# Patient Record
Sex: Male | Born: 1960 | Race: White | Hispanic: No | Marital: Single | Smoking: Former smoker
Health system: Southern US, Community
[De-identification: ages and names within clinical notes are randomized; demographics above are authoritative.]

## PROBLEM LIST (undated history)

## (undated) DIAGNOSIS — I255 Ischemic cardiomyopathy: Secondary | ICD-10-CM

## (undated) DIAGNOSIS — I6529 Occlusion and stenosis of unspecified carotid artery: Secondary | ICD-10-CM

## (undated) DIAGNOSIS — I219 Acute myocardial infarction, unspecified: Secondary | ICD-10-CM

## (undated) DIAGNOSIS — E785 Hyperlipidemia, unspecified: Secondary | ICD-10-CM

## (undated) DIAGNOSIS — I2510 Atherosclerotic heart disease of native coronary artery without angina pectoris: Secondary | ICD-10-CM

## (undated) DIAGNOSIS — Z720 Tobacco use: Secondary | ICD-10-CM

## (undated) DIAGNOSIS — E119 Type 2 diabetes mellitus without complications: Secondary | ICD-10-CM

## (undated) HISTORY — DX: Atherosclerotic heart disease of native coronary artery without angina pectoris: I25.10

## (undated) HISTORY — DX: Ischemic cardiomyopathy: I25.5

## (undated) HISTORY — DX: Occlusion and stenosis of unspecified carotid artery: I65.29

## (undated) HISTORY — PX: NO PAST SURGERIES: SHX2092

## (undated) HISTORY — DX: Hyperlipidemia, unspecified: E78.5

## (undated) HISTORY — DX: Type 2 diabetes mellitus without complications: E11.9

## (undated) HISTORY — DX: Acute myocardial infarction, unspecified: I21.9

## (undated) HISTORY — DX: Tobacco use: Z72.0

---

## 1997-10-25 ENCOUNTER — Emergency Department (HOSPITAL_COMMUNITY): Admission: EM | Admit: 1997-10-25 | Discharge: 1997-10-25 | Payer: Self-pay | Admitting: *Deleted

## 1998-04-02 ENCOUNTER — Emergency Department (HOSPITAL_COMMUNITY): Admission: EM | Admit: 1998-04-02 | Discharge: 1998-04-02 | Payer: Self-pay | Admitting: Emergency Medicine

## 1998-04-05 ENCOUNTER — Encounter: Payer: Self-pay | Admitting: Emergency Medicine

## 1998-04-05 ENCOUNTER — Ambulatory Visit (HOSPITAL_COMMUNITY): Admission: RE | Admit: 1998-04-05 | Discharge: 1998-04-05 | Payer: Self-pay | Admitting: Emergency Medicine

## 1999-09-16 ENCOUNTER — Encounter: Payer: Self-pay | Admitting: Emergency Medicine

## 1999-09-16 ENCOUNTER — Inpatient Hospital Stay (HOSPITAL_COMMUNITY): Admission: EM | Admit: 1999-09-16 | Discharge: 1999-09-18 | Payer: Self-pay | Admitting: Emergency Medicine

## 1999-09-25 ENCOUNTER — Encounter: Admission: RE | Admit: 1999-09-25 | Discharge: 1999-09-25 | Payer: Self-pay | Admitting: Family Medicine

## 2002-09-12 ENCOUNTER — Emergency Department (HOSPITAL_COMMUNITY): Admission: EM | Admit: 2002-09-12 | Discharge: 2002-09-12 | Payer: Self-pay | Admitting: Emergency Medicine

## 2002-11-10 ENCOUNTER — Emergency Department (HOSPITAL_COMMUNITY): Admission: EM | Admit: 2002-11-10 | Discharge: 2002-11-10 | Payer: Self-pay

## 2004-04-26 ENCOUNTER — Emergency Department (HOSPITAL_COMMUNITY): Admission: EM | Admit: 2004-04-26 | Discharge: 2004-04-26 | Payer: Self-pay | Admitting: Emergency Medicine

## 2009-09-16 ENCOUNTER — Inpatient Hospital Stay (HOSPITAL_COMMUNITY): Admission: AC | Admit: 2009-09-16 | Discharge: 2009-09-21 | Payer: Self-pay

## 2009-09-16 ENCOUNTER — Ambulatory Visit: Payer: Self-pay | Admitting: Internal Medicine

## 2009-09-17 ENCOUNTER — Encounter: Payer: Self-pay | Admitting: Cardiology

## 2009-09-18 ENCOUNTER — Encounter: Payer: Self-pay | Admitting: Cardiology

## 2009-09-20 ENCOUNTER — Encounter: Payer: Self-pay | Admitting: Cardiology

## 2009-09-25 ENCOUNTER — Telehealth: Payer: Self-pay | Admitting: Cardiology

## 2009-09-26 ENCOUNTER — Encounter: Payer: Self-pay | Admitting: Cardiology

## 2009-09-27 ENCOUNTER — Ambulatory Visit: Payer: Self-pay | Admitting: Cardiology

## 2009-09-27 DIAGNOSIS — I5022 Chronic systolic (congestive) heart failure: Secondary | ICD-10-CM

## 2009-09-27 DIAGNOSIS — I6529 Occlusion and stenosis of unspecified carotid artery: Secondary | ICD-10-CM

## 2009-09-27 DIAGNOSIS — E785 Hyperlipidemia, unspecified: Secondary | ICD-10-CM

## 2009-09-27 DIAGNOSIS — I2510 Atherosclerotic heart disease of native coronary artery without angina pectoris: Secondary | ICD-10-CM | POA: Insufficient documentation

## 2009-09-28 LAB — CONVERTED CEMR LAB
CO2: 31 meq/L (ref 19–32)
Calcium: 8.8 mg/dL (ref 8.4–10.5)
Chloride: 102 meq/L (ref 96–112)
GFR calc non Af Amer: 68 mL/min (ref >60)
Potassium: 5.1 meq/L (ref 3.5–5.1)
Pro B Natriuretic peptide (BNP): 401.8 pg/mL — ABNORMAL HIGH (ref 0.0–100.0)
Sodium: 139 meq/L (ref 135–145)

## 2009-10-03 DIAGNOSIS — I6529 Occlusion and stenosis of unspecified carotid artery: Secondary | ICD-10-CM

## 2009-10-03 HISTORY — DX: Occlusion and stenosis of unspecified carotid artery: I65.29

## 2009-10-04 ENCOUNTER — Telehealth: Payer: Self-pay | Admitting: Cardiology

## 2009-10-04 ENCOUNTER — Encounter (HOSPITAL_COMMUNITY): Admission: RE | Admit: 2009-10-04 | Discharge: 2010-01-02 | Payer: Self-pay | Admitting: Cardiology

## 2009-10-23 ENCOUNTER — Encounter: Payer: Self-pay | Admitting: Cardiology

## 2009-10-31 ENCOUNTER — Encounter: Payer: Self-pay | Admitting: Cardiology

## 2009-11-01 ENCOUNTER — Ambulatory Visit: Payer: Self-pay

## 2009-11-01 ENCOUNTER — Encounter: Payer: Self-pay | Admitting: Cardiology

## 2009-11-01 ENCOUNTER — Ambulatory Visit (HOSPITAL_COMMUNITY): Admission: RE | Admit: 2009-11-01 | Discharge: 2009-11-01 | Payer: Self-pay | Admitting: Cardiology

## 2009-11-01 ENCOUNTER — Ambulatory Visit: Payer: Self-pay | Admitting: Cardiology

## 2009-11-13 ENCOUNTER — Encounter: Payer: Self-pay | Admitting: Cardiology

## 2009-11-15 ENCOUNTER — Ambulatory Visit: Payer: Self-pay | Admitting: Endocrinology

## 2009-11-15 ENCOUNTER — Encounter: Payer: Self-pay | Admitting: Endocrinology

## 2009-11-15 DIAGNOSIS — M25559 Pain in unspecified hip: Secondary | ICD-10-CM

## 2009-11-15 DIAGNOSIS — N529 Male erectile dysfunction, unspecified: Secondary | ICD-10-CM

## 2009-11-21 ENCOUNTER — Encounter: Payer: Self-pay | Admitting: Endocrinology

## 2009-11-29 ENCOUNTER — Encounter: Payer: Self-pay | Admitting: Endocrinology

## 2009-12-04 ENCOUNTER — Telehealth: Payer: Self-pay | Admitting: Cardiology

## 2009-12-06 ENCOUNTER — Encounter (INDEPENDENT_AMBULATORY_CARE_PROVIDER_SITE_OTHER): Payer: Self-pay | Admitting: *Deleted

## 2009-12-28 ENCOUNTER — Ambulatory Visit: Payer: Self-pay | Admitting: Endocrinology

## 2010-01-11 ENCOUNTER — Ambulatory Visit: Payer: Self-pay | Admitting: Cardiology

## 2010-01-11 ENCOUNTER — Ambulatory Visit (HOSPITAL_COMMUNITY): Admission: RE | Admit: 2010-01-11 | Discharge: 2010-01-11 | Payer: Self-pay | Admitting: Cardiology

## 2010-02-01 ENCOUNTER — Encounter: Payer: Self-pay | Admitting: Endocrinology

## 2010-02-15 ENCOUNTER — Ambulatory Visit: Payer: Self-pay | Admitting: Endocrinology

## 2010-02-15 DIAGNOSIS — R5381 Other malaise: Secondary | ICD-10-CM | POA: Insufficient documentation

## 2010-02-15 DIAGNOSIS — R5383 Other fatigue: Secondary | ICD-10-CM

## 2010-02-16 ENCOUNTER — Telehealth (INDEPENDENT_AMBULATORY_CARE_PROVIDER_SITE_OTHER): Payer: Self-pay | Admitting: *Deleted

## 2010-02-26 ENCOUNTER — Ambulatory Visit: Payer: Self-pay | Admitting: Cardiology

## 2010-03-08 ENCOUNTER — Ambulatory Visit: Payer: Self-pay | Admitting: Endocrinology

## 2010-03-15 ENCOUNTER — Ambulatory Visit: Payer: Self-pay | Admitting: Cardiology

## 2010-03-19 ENCOUNTER — Telehealth: Payer: Self-pay | Admitting: Endocrinology

## 2010-03-21 ENCOUNTER — Telehealth: Payer: Self-pay | Admitting: Cardiology

## 2010-03-21 LAB — CONVERTED CEMR LAB
BUN: 21 mg/dL (ref 6–23)
Basophils Relative: 0.6 % (ref 0.0–3.0)
CO2: 30 meq/L (ref 19–32)
Calcium: 9.2 mg/dL (ref 8.4–10.5)
GFR calc non Af Amer: 100.65 mL/min (ref >60)
Glucose, Bld: 295 mg/dL — ABNORMAL HIGH (ref 70–99)
HCT: 36.1 % — ABNORMAL LOW (ref 39.0–52.0)
Monocytes Relative: 7.9 % (ref 3.0–12.0)
Neutro Abs: 4.1 10*3/uL (ref 1.4–7.7)
RDW: 13.1 % (ref 11.5–14.6)

## 2010-04-12 ENCOUNTER — Ambulatory Visit: Payer: Self-pay | Admitting: Endocrinology

## 2010-05-05 DIAGNOSIS — I219 Acute myocardial infarction, unspecified: Secondary | ICD-10-CM

## 2010-05-05 HISTORY — DX: Acute myocardial infarction, unspecified: I21.9

## 2010-05-05 HISTORY — PX: CARDIAC CATHETERIZATION: SHX172

## 2010-05-14 ENCOUNTER — Telehealth: Payer: Self-pay | Admitting: Cardiology

## 2010-05-16 ENCOUNTER — Telehealth: Payer: Self-pay | Admitting: Cardiology

## 2010-05-20 ENCOUNTER — Telehealth: Payer: Self-pay | Admitting: Cardiology

## 2010-05-26 ENCOUNTER — Encounter: Payer: Self-pay | Admitting: Cardiology

## 2010-06-04 NOTE — Assessment & Plan Note (Signed)
Summary: NEW ENDO CON/UHC / DM /NWS   Vital Signs:  Patient profile:   50 year old male Height:      69 inches (175.26 cm) Weight:      187 pounds (85 kg) BMI:     27.71 O2 Sat:      97 % on Room air Temp:     99.9 degrees F (37.72 degrees C) oral Pulse rate:   62 / minute Pulse rhythm:   regular BP sitting:   112 / 68  (left arm) Cuff size:   regular  Vitals Entered By: Brenton Grills MA (November 15, 2009 3:57 PM)  O2 Flow:  Room air CC: New endo pt/DM/aj  Comments Pt is only using Novolog Flex Pen--Novolog 100U/ml should be removed from list   Primary Provider:  [facility]  CC:  New endo pt/DM/aj .  History of Present Illness: pt states 14 years h/o dm.  it is complicated by cad.  he has been on insulin since dx.  he takes lantus 30 units qd, and prn novolog (avg 24-30 unitd/day total).   no cbg record, but states cbg's were well-controlled, until his mi (may, 2011).  since then, it is highest in am (200's), then lower at other times of day.  it is higher in am than at hs, despite no hs-snack.  he has hypolgycemia before lunch. pt says his diet is "not very good," and exercise is "good."   symptomatically, pt states few mos of moderate left hip pain, but no associated numbness.  Current Medications (verified): 1)  Effient 10 Mg Tabs (Prasugrel Hcl) .... Take One Tablet Once Daily 2)  Crestor 40 Mg Tabs (Rosuvastatin Calcium) .... Take One Tablet Once Daily 3)  Coreg 12.5 Mg Tabs (Carvedilol) .... One Twice A Day 4)  Spironolactone 25 Mg Tabs (Spironolactone) .... Take One Tablet Once Daily 5)  Enalapril Maleate 5 Mg Tabs (Enalapril Maleate) .... Take 1.5 Tablets Two Times A Day 6)  Vitamin E 400 Unit Caps (Vitamin E) .... 2 Capsules Daily 7)  Cinnamon Oil  Oil (Cassia Oil) .Marland Kitchen.. 1000 Two Times A Day 8)  Lantus 100 Unit/ml Soln (Insulin Glargine) .... 30 Units Daily 9)  Novolog 100 Unit/ml Soln (Insulin Aspart) .... Sliding Scale 10)  Novolog Flexpen 100 Unit/ml Soln (Insulin  Aspart) .... Sliding Scale 11)  Aspirin Ec 325 Mg Tbec (Aspirin) .... Take One Tablet By Mouth Daily 12)  Tramadol Hcl 50 Mg Tabs (Tramadol Hcl) .... Take 1 or 2 Tablets Two Times A Day As Needed For Pain 13)  Fish Oil 1000 Mg Caps (Omega-3 Fatty Acids) .... One Twice A Day 14)  Nitrostat 0.4 Mg Subl (Nitroglycerin) .Marland Kitchen.. 1 Sl As Needed Chest Pain 15)  Garlic-Parsley 147-82 Mg Caps (Garlic-Parsley) .Marland Kitchen.. 1 Capsule Once Daily  Allergies (verified): 1)  ! Codeine  Past History:  Past Medical History: Last updated: 09/27/2009 1. Diabetes mellitus: on insulin 2. Ischemic cardiomyopathy: Echo (5/11) EF 30-35% with inferior and inferoseptal akinesis, posterior hypokinesis, mild MR, mild to moderately decreased RV systolic function, dilated IVC.  3. Coronary artery disease: Inferior STEMI 5/11 complicated by cardiac arrest requiring CPR.  LHC showed total occlusion proximal RCA, 60-70% proximal LAD at D1, and 60% ostial D1.  Patient had 2 overlapping PROMUS stents to the RCA.  4. Probably foley catheter trauma with hematuria (5/11) 5. Tobacco abuse 6.  Hyperlipidemia  Family History: Reviewed history from 09/27/2009 and no changes required. Notable for a father with a myocardial  infarction in his 65s.  Family History of Arthritis Heart Disease (Parents) Family History Diabetes 1st degree relative (grandparent) Family History High cholesterol Family History Hypertension Family History Lung cancer Family History of Stroke   Social History: Reviewed history from 09/27/2009 and no changes required. The patient lives in [place] with his wife and son.  He has a 1-pack-per-day history of smoking for approximately 30 years.  He denies any alcohol or drug use. Works in [employer].   Review of Systems       The patient complains of weight gain and depression.  The patient denies syncope.         denies headache, chest pain, sob, n/v, urinary frequency, cramps, excessive diaphoresis,  rhinorrhea, and easy bruising.  he has blurry vision, and mild memory loss and erectile dysfunction.  Physical Exam  General:  normal appearance.   Head:  there is a 2 cm sebaceous cyst on the crown of the head head: no deformity eyes: no periorbital swelling, no proptosis external nose and ears are normal mouth: no lesion seen Neck:  Supple without thyroid enlargement or tenderness.  Lungs:  Clear to auscultation bilaterally. Normal respiratory effort.  Heart:  Regular rate and rhythm without murmurs or gallops noted. Normal S1,S2.   Abdomen:  abdomen is soft, nontender.  no hepatosplenomegaly.   not distended.  no hernia  Msk:  left hip is nontender muscle bulk and strength are grossly normal.  no obvious joint swelling.  gait is normal and steady  Pulses:  dorsalis pedis intact bilat.  no carotid bruit  Extremities:  no deformity.  no ulcer on the feet.  feet are of normal color and temp.  no edema  Neurologic:  cn 2-12 grossly intact.   readily moves all 4's.   sensation is intact to touch on the feet  Skin:  normal texture and temp.  no rash.  not diaphoretic  Cervical Nodes:  No significant adenopathy.  Psych:  Alert and cooperative; normal mood and affect; normal attention span and concentration.   Additional Exam:  a1c=9.2 (in hospital)   Impression & Recommendations:  Problem # 1:  DIAB W/O COMP TYPE II/UNS NOT STATED UNCNTRL (ICD-250.00) needs increased rx  Problem # 2:  CHRONIC SYSTOLIC HEART FAILURE (ICD-428.22) as he is at risk for tachydysrhythmia, it is very important to avoid hypoglycemia, so he should have a continuous glucose monitor.    Problem # 3:  HIP PAIN, LEFT (ICD-719.45) not related to #1  Problem # 4:  blurry vision could be related to #1  Medications Added to Medication List This Visit: 1)  Lantus 100 Unit/ml Soln (Insulin glargine) .... 30 units daily, and 31g syringes 2)  Novolog Flexpen 100 Unit/ml Soln (Insulin aspart) .... Three times  a day (just before each meal) 12-09-08 units 3)  Garlic-parsley 914-78 Mg Caps (Garlic-parsley) .Marland Kitchen.. 1 capsule once daily 4)  Bayer Breeze 2 Test Disk (Glucose blood) .... Three times a day, and lancets 250.01 5)  Bd Pen Needle Short U/f 31g X 8 Mm Misc (Insulin pen needle) .... 4x a day  Other Orders: T-Hip Comp Left Min 2-views (73510TC) Diabetic Clinic Referral (Diabetic) Consultation Level IV (29562)  Patient Instructions: 1)  good diet and exercise habits significanly improve the control of your diabetes.  please let me know if you wish to be referred to a dietician.  high blood sugar is very risky to your health.  in view of you heart condition, low-blood sugar is very  risky, also.  you should see an eye doctor every year. 2)  controlling your blood pressure and cholesterol drastically reduces the damage diabetes does to your body.  this also applies to quitting smoking.  please discuss these with your doctor.  you should take an aspirin every day, unless you have been advised by a doctor not to. 3)  we will need to take this complex situation in stages 4)  check your blood sugar 2-3 times a day.  vary the time of day when you check, between before the 3 meals, and at bedtime.  also check if you have symptoms of your blood sugar being too high or too low.  please keep a record of the readings and bring it to your next appointment here.  please call us sooner if you are having low blood sugar episodes. 5)  continue lantus 30 units once daily 6)  take novolog at a fixed-dosage (just before each meal) 12-09-08 units.  if exertion is anticipated, subtract 1 unit of novolog from that injection.  if blood sugar is over 250, take 1 extra unit of novolog. 7)  refer diabetes educator, to consider continuous glucose monitor.  you will be called with a day and time for an appointment. 8)  Please schedule a follow-up appointment in 1 month. 9)  x ray of your left hip.  please call [phone] to hear your test  results. Prescriptions: LANTUS 100 UNIT/ML SOLN (INSULIN GLARGINE) 30 units daily, and 31g syringes  #3 vials x 3   Entered and Authorized by:   Minus Breeding MD   Signed by:   Minus Breeding MD on 11/15/2009   Method used:   Electronically to        [facility] [address] [phone]* (retail)       [address]       [place], [place]  [address]       Ph: [phone] or [phone]       Fax: [phone]   RxID:   [id number] BD PEN NEEDLE SHORT U/F 31G X 8 MM MISC (INSULIN PEN NEEDLE) 4x a day  #120 x 11   Entered and Authorized by:   Minus Breeding MD   Signed by:   Minus Breeding MD on 11/15/2009   Method used:   Electronically to        [facility] [address] [phone]* (retail)       [address]       [place], [place]  [address]       Ph: [phone] or [phone]       Fax: [phone]   RxID:   [id number] BAYER BREEZE 2 TEST  DISK (GLUCOSE BLOOD) three times a day, and lancets 250.01  #100 x 11   Entered and Authorized by:   Minus Breeding MD   Signed by:   Minus Breeding MD on 11/15/2009   Method used:   Electronically to        [facility] [address] [phone]* (retail)       [address]       [place], [place]  [address]       Ph: [phone] or [phone]       Fax: [phone]   RxID:   [id number]    Preventive Care Screening  Last Tetanus Booster:    Date:  05/06/2007    Results:  Historical     Immunization History:  Pneumovax Immunization History:    Pneumovax:  historical (02/02/2009)

## 2010-06-04 NOTE — Letter (Signed)
Summary: [facility]  [facility]   Imported By: Sherian Rein 12/06/2009 09:43:37  _____________________________________________________________________  External Attachment:    Type:   Image     Comment:   External Document

## 2010-06-04 NOTE — Letter (Signed)
Summary: Generic Letter  Architectural technologist, Main Office  [address]   [place], [place] [address]   Phone: [phone]  Fax: [phone]        November 01, 2009 MRN: [id number]    [facility] [place] [address] [place], [place]  [address]    To Whom It May Concern:  Mr. Treese can return to work Tuesday July 5,2011 without restrictions.         Sincerely,    Tristyn Pharris,MD  This letter has been electronically signed by your physician.

## 2010-06-04 NOTE — Letter (Signed)
Summary: [facility]  [facility]   Imported By: Lester [place] 02/15/2010 10:29:21  _____________________________________________________________________  External Attachment:    Type:   Image     Comment:   External Document

## 2010-06-04 NOTE — Progress Notes (Signed)
Summary: pt needs meds for tooth  Phone Note Call from Patient Call back at [phone]   Caller: Spouse/janice Reason for Call: Talk to Nurse, Lab or Test Results Summary of Call: pt wife states dr. Shirlee Latch would give  pt antibiotics for his tooth if their was a problem with the dentist. pt also needs blood work results. Initial call taken by: Roe Coombs,  March 21, 2010 11:04 AM  Follow-up for Phone Call        [phone] talked with wife--pt had gone to dentist to have tooth extracted--pt has decided he would like to see an oral surgeon--she has made an appointment with the oral surgeon --per wife --now pt is having tooth pain , the dentist has told the patient the tooth is infected but wants Dr Shirlee Latch  to prescribe an antibiotic because of his medicaiton and cardiac history--I will forward to Dr Shirlee Latch for review      Appended Document: pt needs meds for tooth Whatever the dentist would typically prescribe would be ok, if he wants me to prescribe it patient could take Augmentin 875 two times a day x 10 days.   Appended Document: pt needs meds for tooth    Clinical Lists Changes  Medications: Added new medication of AUGMENTIN 875-125 MG TABS (AMOXICILLIN-POT CLAVULANATE) one twice a day for 10 days - Signed Rx of AUGMENTIN 875-125 MG TABS (AMOXICILLIN-POT CLAVULANATE) one twice a day for 10 days;  #20 x 0;  Signed;  Entered by: Katina Dung, RN, BSN;  Authorized by: Marca Ancona, MD;  Method used: [id number] [address] [place], [place], [place]  [address], Ph: [phone], Fax: [phone]    Prescriptions: AUGMENTIN 875-125 MG TABS (AMOXICILLIN-POT CLAVULANATE) one twice a day for 10 days  #20 x 0   Entered by:   Katina Dung, RN, BSN   Authorized by:   Marca Ancona, MD   Signed by:   Katina Dung, RN, BSN on 03/21/2010   Method used:   [facility]        [facility]  [address] [id number] ([facility]ail)       [address]       [place], [place]  [address]       Ph: [phone]       Fax: [phone]   RxID:   [id number]   pt's wife aware

## 2010-06-04 NOTE — Progress Notes (Signed)
  Phone Note Call from Patient   Caller: Patient [phone] Summary of Call: Pt called stating that Breeze 2 test strips are no longer covered by his Insurance. Pt is requesting to change to OneTouch UltraMini Glucometer, test strips and lancets. Please advise Initial call taken by: Margaret Pyle, CMA,  March 19, 2010 10:36 AM  Follow-up for Phone Call        i sent Follow-up by: Minus Breeding MD,  March 19, 2010 12:41 PM  Additional Follow-up for Phone Call Additional follow up Details #1::        Patient wife notified and he wil pick up at pharmacy.Alvy Beal Archie CMA  March 19, 2010 1:28 PM     New/Updated Medications: ONETOUCH ULTRA MINI W/DEVICE KIT (BLOOD GLUCOSE MONITORING SUPPL) as dir ONETOUCH ULTRA BLUE  STRP (GLUCOSE BLOOD) 4x a day, and lancets 250.03.  variable glucoses. Prescriptions: ONETOUCH ULTRA BLUE  STRP (GLUCOSE BLOOD) 4x a day, and lancets 250.03.  variable glucoses.  #120 x 11   Entered and Authorized by:   Minus Breeding MD   Signed by:   Minus Breeding MD on 03/19/2010   Method used:   Electronically to        [facility] [address] [phone]* (retail)       [address]       [place], [place]  [address]       Ph: [phone] or [phone]       Fax: [phone]   RxID:   [id number] [place] [address] MINI W/DEVICE KIT (BLOOD GLUCOSE MONITORING SUPPL) as dir  #1 device x 0   Entered and Authorized by:   Minus Breeding MD   Signed by:   Minus Breeding MD on 03/19/2010   Method used:   Electronically to        [facility] [address] [phone]* (retail)       [address]       [place], [place]  [address]       Ph: [phone] or [phone]       Fax: [phone]   RxID:   [id number]

## 2010-06-04 NOTE — Miscellaneous (Signed)
Summary: [facility]   [facility]   Imported By: Roderic Ovens 11/09/2009 15:38:54  _____________________________________________________________________  External Attachment:    Type:   Image     Comment:   External Document

## 2010-06-04 NOTE — Letter (Signed)
Summary: Primary Care Appointment Letter  [facility]  [address] [place], [place] [address]   Phone: [phone]  Fax: [phone]    11/21/2009 MRN: [id number]  [facility] [place] [address] [place], [place]  [address]  Dear Mr. Erik Bird,   Your Primary Care Physician Minus Breeding MD has indicated that:    ___X____it is time to schedule an appointment for a one month follow-up with Dr. Everardo All in mid August.  Please call the office.    _______you missed your appointment on______ and need to call and          reschedule.    _______you need to have lab work done.    _______you need to schedule an appointment discuss lab or test results.    _______you need to call to reschedule your appointment that is                       scheduled on _________.     Please call our office as soon as possible. Our phone number is [phone]. Please press option 1. Our office is open 8a-12noon and 1p-5p, Monday through Friday.    Please have your wife call to set up an appointment with Dr. Yetta Barre or Dr. Felicity Coyer to establish for Primary Care.   Thank you,    [facility]

## 2010-06-04 NOTE — Assessment & Plan Note (Signed)
Summary: p hosp   Primary Provider:  [facility]  CC:  p hosp.  Pt needs disability papers filled out due to loss of work.  Headache still continuous.  Pt has gotten no phone calls from Cardiac Rehab and has no idea what type of care he should be getting.  Spouse has no idea how she should be caring for her husband.  Spouse reports urinary bleeding and constant urinary pain.  Marland Kitchen  History of Present Illness: 50 yo with history of diabetes was admitted to [facility] in 5/11 with inferior STEMI. Patient had collapsed at home and got CPR from his son and EMS for cardiac arrest.  He had a perfusing rhythm in the ER.  Left heart cath showed a totally occluded RCA which was treated with 2 PROMUS drug-eluting stents.  Echo showed EF 30-35% with inferior and inferoseptal akinesis and RV dysfunction.  Patient's course was complicated by chest pain from broken ribs (CPR) and probable foley catheter trauma with hematuria and dysuria after the catheter was removed.   Since discharge, patient has continued to have hematuria, though it is improved.  He has followed up with Dr Annabell Howells (urology) and plan has been to observe to see if hematuria resolves over the next week or so.  Patient continues to have rib pain.  He is walking with his wife up and down his 70 foot driveway several times a day.  He denies shortness of breath, orthopnea, or PND.  He has had a mild nagging headache ever since he was in the hospital.    ECG: NSR, old inferior MI  Labs (5/11): K 4.5, creatinine 1.23, BNP 158, LDL 119, HDL 49  Current Medications (verified): 1)  Furosemide 20 Mg Tabs (Furosemide) .... Take One Tablet Once Daily 2)  Effient 10 Mg Tabs (Prasugrel Hcl) .... Take One Tablet Once Daily 3)  Crestor 40 Mg Tabs (Rosuvastatin Calcium) .... Take One Tablet Once Daily 4)  Carvedilol 6.25 Mg Tabs (Carvedilol) .... Take One Tablet Two Times A Day 5)  Phenazopyridine Hcl 200 Mg Tabs (Phenazopyridine Hcl) .... Take One Tablet Three  Times A Day 6)  Spironolactone 25 Mg Tabs (Spironolactone) .... Take One Tablet Once Daily 7)  Enalapril Maleate 5 Mg Tabs (Enalapril Maleate) .... Take 1.5 Tablets Two Times A Day 8)  Vitamin E 400 Unit Caps (Vitamin E) .... 2 Capsules Daily 9)  Cinnamon Oil  Oil (Cassia Oil) .Marland Kitchen.. 1000 Two Times A Day 10)  Lantus 100 Unit/ml Soln (Insulin Glargine) .... 30 Units Daily 11)  Novolog 100 Unit/ml Soln (Insulin Aspart) .... Sliding Scale 12)  Novolog Flexpen 100 Unit/ml Soln (Insulin Aspart) .... Sliding Scale 13)  Aspirin Ec 325 Mg Tbec (Aspirin) .... Take One Tablet By Mouth Daily  Allergies (verified): 1)  ! Codeine  Past History:  Past Medical History: 1. Diabetes mellitus: on insulin 2. Ischemic cardiomyopathy: Echo (5/11) EF 30-35% with inferior and inferoseptal akinesis, posterior hypokinesis, mild MR, mild to moderately decreased RV systolic function, dilated IVC.  3. Coronary artery disease: Inferior STEMI 5/11 complicated by cardiac arrest requiring CPR.  LHC showed total occlusion proximal RCA, 60-70% proximal LAD at D1, and 60% ostial D1.  Patient had 2 overlapping PROMUS stents to the RCA.  4. Probably foley catheter trauma with hematuria (5/11) 5. Tobacco abuse 6.  Hyperlipidemia  Family History: Notable for a father with a myocardial infarction in his 46s.   Social History: The patient lives in [place] with his wife  and son.  He has a 1-pack-per-day history of smoking for approximately 30 years.  He denies any alcohol or drug use. Works in [employer].   Review of Systems       All systems reviewed and negative except as per HPI.   Vital Signs:  Patient profile:   50 year old male Height:      69 inches Weight:      181 pounds BMI:     26.83 Pulse rate:   71 / minute Pulse rhythm:   regular BP sitting:   116 / 60  (left arm) Cuff size:   regular  Vitals Entered By: Judithe Modest CMA (Sep 27, 2009 2:00 PM)  Physical Exam  General:  Well developed,  well nourished, in no acute distress. Head:  normocephalic and atraumatic Nose:  no deformity, discharge, inflammation, or lesions Mouth:  Teeth, gums and palate normal. Oral mucosa normal. Neck:  Neck supple, no JVD. No masses, thyromegaly or abnormal cervical nodes. Lungs:  Clear bilaterally to auscultation and percussion. Heart:  Non-displaced PMI, chest non-tender; regular rate and rhythm, S1, S2 without murmurs, rubs or gallops. Carotid upstroke normal, left carotid bruit.  Pedals normal pulses. 1+ ankle edema. Abdomen:  Bowel sounds positive; abdomen soft and non-tender without masses, organomegaly, or hernias noted. No hepatosplenomegaly. Msk:  Back normal, normal gait. Muscle strength and tone normal. Extremities:  No clubbing or cyanosis. Neurologic:  Alert and oriented x 3. Skin:  Intact without lesions or rashes. Psych:  Normal affect.   Impression & Recommendations:  Problem # 1:  CORONARY ATHEROSCLEROSIS NATIVE CORONARY ARTERY (ICD-414.01) Stable s/p inferior STEMI with RCA PCI.  He will need prasugrel for at least a year as well as ASA. Continue Crestor, Coreg, ACEI.  Patient has a residual moderate LAD stenosis.  Patient will start cardiac rehab.   Problem # 2:  CHRONIC SYSTOLIC HEART FAILURE (ICD-428.22) Ischemic cardiomyopathy with EF 30-35% on echo.  Patient does not look particularly volume overloaded today, and I wonder if a component of dehydration is driving his headaches.  I will have him stop Lasix for now and follow his weights.  If weight is going up or he becomes more short of breath, he will restart Lasix. Continue current doses of Coreg, enalapril, and spironolactone.  Will check BMET/BNP today.  I will have him get an echo in 1 month.  If EF is still depressed, he will need an ICD.   Problem # 3:  CAROTID ARTERY STENOSIS (ICD-433.10) Left carotid bruit (soft), will get carotid dopplers.   Problem # 4:  HYPERLIPIDEMIA-MIXED (ICD-272.4) Goal LDL < 70.  Will need  lipids/LFTs in 7/11.   He will try tramadol for pain (dysuria, pain in chest wall).   Other Orders: TLB-BMP (Basic Metabolic Panel-BMET) (80048-METABOL) TLB-BNP (B-Natriuretic Peptide) (83880-BNPR) Echocardiogram (Echo) Carotid Duplex (Carotid Duplex)  Patient Instructions: 1)  Your physician recommends that you schedule a follow-up appointment in: 1 month (after echo) 2)  Your physician has recommended you make the following change in your medication: Stop furosemide 3)  Start tramadol 50 mg one or two tablets by mouth two times a day as needed for pain 4)  Your physician has requested that you have a carotid duplex. This test is an ultrasound of the carotid arteries in your neck. It looks at blood flow through these arteries that supply the brain with blood. Allow one hour for this exam. There are no restrictions or special instructions. 5)  Your physician has requested  that you have an echocardiogram.  Echocardiography is a painless test that uses sound waves to create images of your heart. It provides your doctor with information about the size and shape of your heart and how well your heart's chambers and valves are working.  This procedure takes approximately one hour. There are no restrictions for this procedure. To be done in one month Prescriptions: TRAMADOL HCL 50 MG TABS (TRAMADOL HCL) take 1 or 2 tablets two times a day as needed for pain  #60 x 1   Entered by:   Dossie Arbour, RN, BSN   Authorized by:   Marca Ancona, MD   Signed by:   Dossie Arbour, RN, BSN on 09/27/2009   Method used:   Print then Give to Patient   RxID:   [id number]

## 2010-06-04 NOTE — Progress Notes (Signed)
Summary: wife calling re setting up mri  Phone Note Call from Patient   Caller: Spouse janice Reason for Call: Talk to Nurse Summary of Call: pt's wife was called with echo results last week, it was determined that a mri would be neccessary and was told by anne that someone would call her and set it up, they haven't heard anything-pls call [phone] Initial call taken by: Glynda Jaeger,  December 04, 2009 8:22 AM  Follow-up for Phone Call        SPOKE WITH GEISLA  WILL CALL PT AND [facility] CARDIAC MRI   Follow-up by: Scherrie Bateman, LPN,  December 04, 2009 10:29 AM

## 2010-06-04 NOTE — Miscellaneous (Signed)
Summary: [facility]   [facility]   Imported By: Roderic Ovens 11/21/2009 10:32:13  _____________________________________________________________________  External Attachment:    Type:   Image     Comment:   External Document

## 2010-06-04 NOTE — Miscellaneous (Signed)
Summary: Appointment Canceled  Appointment status changed to canceled by [employer] on 11/15/2009 2:04 PM.  Cancellation Comments --------------------- echo/414.01/uch/no prec. req/saf  Appointment Information ----------------------- Appt Type:  CARDIOLOGY ANCILLARY VISIT      Date:  Tuesday, December 25, 2009      Time:  2:00 PM for 60 min   Urgency:  Routine   Made By:  Pearson Grippe  To Visit:  [id number]    Reason:  echo/414.01/uch/no prec. req/saf  Appt Comments ------------- -- 11/15/09 14:04: ([facility]) CANCELED -- echo/414.01/uch/no prec. req/saf -- 11/01/09 11:15: ([facility]) BOOKED -- Routine CARDIOLOGY ANCILLARY VISIT at 12/25/2009 2:00 PM for 60 min echo/414.01/uch/no prec. req/saf

## 2010-06-04 NOTE — Assessment & Plan Note (Signed)
Summary: per check out/sf   Primary Provider:  Minus Breeding MD   History of Present Illness: 50 yo with history of diabetes was admitted to [facility] in 5/11 with inferior STEMI. Patient had collapsed at home and got CPR from his son and EMS for cardiac arrest.  He had a perfusing rhythm in the ER.  Left heart cath showed a totally occluded RCA which was treated with 2 PROMUS drug-eluting stents.  Followup echo showed EF 35-40% with inferior and inferoseptal akinesis.  Cardiac MRI was done in 9/11 to quantify EF for possible ICD.  This showed EF 42% with inferior scar.   Patient has been doing well in general.  He is back at work full time.  He is very active and able to do all physical work without any problem.  No exertional dyspnea or chest pain.  Blood glucose is coming under control.  Main complaint has been fatigue.  His energy level is not the same as before MI.  He also is having some pain from a broken tooth that needs extraction.   Labs (5/11): K 4.5, creatinine 1.23, BNP 158, LDL 119, HDL 41, BNP 402  Current Medications (verified): 1)  Effient 10 Mg Tabs (Prasugrel Hcl) .... Take One Tablet Once Daily 2)  Crestor 40 Mg Tabs (Rosuvastatin Calcium) .... Take One Tablet Once Daily 3)  Coreg 12.5 Mg Tabs (Carvedilol) .... One Twice A Day 4)  Spironolactone 25 Mg Tabs (Spironolactone) .... Take One Tablet Once Daily 5)  Enalapril Maleate 5 Mg Tabs (Enalapril Maleate) .... Take 1.5 Tablets Two Times A Day 6)  Vitamin E 400 Unit Caps (Vitamin E) .... 2 Capsules Daily 7)  Cinnamon Oil  Oil (Cassia Oil) .Marland Kitchen.. 1000 Two Times A Day 8)  Novolog Flexpen 100 Unit/ml Soln (Insulin Aspart) .... As Needed Use 9)  Aspirin Ec 325 Mg Tbec (Aspirin) .... Take One Tablet By Mouth Daily 10)  Tramadol Hcl 50 Mg Tabs (Tramadol Hcl) .... Take 1 or 2 Tablets Two Times A Day As Needed For Pain 11)  Fish Oil 1000 Mg Caps (Omega-3 Fatty Acids) .... One Twice A Day 12)  Nitrostat 0.4 Mg Subl (Nitroglycerin)  .Marland Kitchen.. 1 Sl As Needed Chest Pain 13)  Garlic-Parsley 347-42 Mg Caps (Garlic-Parsley) .Marland Kitchen.. 1 Capsule Once Daily 14)  Bayer Breeze 2 Test  Disk (Glucose Blood) .... Three Times A Day, and Lancets 250.01 15)  Bd Pen Needle Short U/f 31g X 8 Mm Misc (Insulin Pen Needle) .... 4x A Day 16)  Lantus Solostar 100 Unit/ml Soln (Insulin Glargine) .... 55 Units Each Am, and Pen Needles 4x A Day  Allergies (verified): 1)  ! Codeine  Past History:  Past Medical History: 1. Diabetes mellitus: on insulin.  Developed diabetes after a coxsackievirus infection.  2. Ischemic cardiomyopathy: Echo (5/11) EF 30-35% with inferior and inferoseptal akinesis, posterior hypokinesis, mild MR, mild to moderately decreased RV systolic function, dilated IVC.   Echo (6/11): EF 35-40%, inferior and inferoseptal akinesis, no significant valvular abnormalities.  Cardiac MRI (9/11): EF 42%, mildly dilated LV, mid to apical inferior and mid posterior 76-99% thickness subendocardial delayed enhancement.  3. Coronary artery disease: Inferior STEMI 5/11 complicated by cardiac arrest requiring CPR.  LHC showed total occlusion proximal RCA, 60-70% proximal LAD at D1, and 60% ostial D1.  Patient had 2 overlapping PROMUS stents to the RCA.  4. Probably foley catheter trauma with hematuria (5/11) 5. Tobacco abuse 6.  Hyperlipidemia 7. Carotid doppler (6/11): 0-39%  bilateral ICA stenosis.   Family History: Reviewed history from 11/15/2009 and no changes required. Notable for a father with a myocardial infarction in his 68s.  Family History of Arthritis Heart Disease (Parents) Family History Diabetes 1st degree relative (grandparent) Family History High cholesterol Family History Hypertension Family History Lung cancer Family History of Stroke   Social History: The patient lives in [place] with his wife and son.  He has a 1-pack-per-day history of smoking for approximately 30 years, quit smoking in 2011.  He denies any  alcohol or drug use. Works in [employer].   Review of Systems       All systems reviewed and negative except as per HPI.   Vital Signs:  Patient profile:   50 year old male Height:      69 inches Weight:      193 pounds BMI:     28.60 Pulse rate:   57 / minute Resp:     18 per minute BP sitting:   135 / 77  (right arm)  Vitals Entered By: Marrion Coy, CNA (February 26, 2010 4:16 PM)  Physical Exam  General:  normal appearance.   Neck:  Neck supple, no JVD. No masses, thyromegaly or abnormal cervical nodes. Lungs:  Clear to auscultation bilaterally. Normal respiratory effort.  Heart:  Non-displaced PMI, chest non-tender; regular rate and rhythm, S1, S2 without murmurs, rubs or gallops. Carotid upstroke normal, no bruit.Pedals normal pulses. No edema, no varicosities. Abdomen:  Bowel sounds positive; abdomen soft and non-tender without masses, organomegaly, or hernias noted. No hepatosplenomegaly. Extremities:  No clubbing or cyanosis. Neurologic:  Alert and oriented x 3. Psych:  Normal affect.   Impression & Recommendations:  Problem # 1:  CORONARY ATHEROSCLEROSIS NATIVE CORONARY ARTERY (ICD-414.01) Stable s/p inferior STEMI with RCA PCI.  He will need prasugrel for at least a year as well as ASA. Continue Crestor, Coreg, ACEI.  Patient has a residual moderate LAD stenosis.  He is doing overall well and is back at work.  He has some fatigue, likely from the MI itself as well as from the medications that he is now taking.  He has a tooth that needs extraction.  This should be done on prasugrel, or if not possible to do on prasugrel, he should wait to have it done.   Problem # 2:  ISCHEMIC CARDIOMYOPATHY No significant exertional dyspnea.  EF 42% by MRI, does not qualify for ICD.  Continue current doses of enalapril, Coreg, and spironolactone.   Problem # 3:  HYPERLIPIDEMIA-MIXED (ICD-272.4) Needs lipids/LFTs checked.  Goal LDL < 70.   Patient Instructions: 1)  Your physician  recommends that you return for a FASTING lipid profile/liver profile/BMP/BNP/CBC  414.01 v58.69 272.0   in a week or two. 2)  Your physician wants you to follow-up in: 6 months with Dr Shirlee Latch.  You will receive a reminder letter in the mail two months in advance. If you don't receive a letter, please call our office to schedule the follow-up appointment.

## 2010-06-04 NOTE — Miscellaneous (Signed)
Summary: [facility]   [facility]   Imported By: Roderic Ovens 11/09/2009 11:05:59  _____________________________________________________________________  External Attachment:    Type:   Image     Comment:   External Document

## 2010-06-04 NOTE — Miscellaneous (Signed)
Summary: [facility] Physician Order/Treatment Plan  [facility] Order/Treatment Plan   Imported By: Roderic Ovens 10/11/2009 15:11:39  _____________________________________________________________________  External Attachment:    Type:   Image     Comment:   External Document

## 2010-06-04 NOTE — Progress Notes (Signed)
Summary: Feeling Bad  Phone Note Call from Patient Call back at [phone]   Caller: wife Summary of Call: Returned call to pt's wife concerning him not feeling well for 72 hours.  While the pt was working on his car today he becam N, diaphoretic and pale (per wife).  These symptoms lasted approximately 45 min and were relieved on there own.  In speaking with her husband he has had 2 similar episodes the previous 2 days while at work.  He has also been fatigued for the past 2 weeks.  The wife states these symptoms are similar to the symptoms the pt had in May with his MI.  He is currently symptoms free.  He is reluctant to come to the ER for finacial reasons and has not taken any nitro secondary to side effects of HA.  His wife called to discuss what to do and when to use his nitro.  I have informed her that if the symptoms are similar to the ones with his MI then it would be my advised to come to the ER for further evaluation.  I have also discussed with her that the pt can use his nitro when he begins to have symptoms like his previous MI, as this does not appear to be chest pain.  The wife voiced understanding and was going to disucss this with her husband.  Of note, he was frustrated she had made the call to our office.  He is currently sitting at home without the above mentioned symptoms.  The wife will call back if there are further questions and she says if the symptoms present again and he will not go to the ER she will call EMS.  She appreciated the call back.  Initial call taken by: Robbi Garter NP-PA,  February 16, 2010 4:03 PM     Appended Document: Feeling Bad If symptoms are similar to prior to MI, should get ETT-myoview.   Appended Document: Feeling Bad LVM for pt. to call us back.  Appended Document: Feeling Bad Tried to reach pt again. He has f/u appt with Dr. Shirlee Latch 10/25.

## 2010-06-04 NOTE — Letter (Signed)
Summary: Appointment - Cardiac MRI  [facility], Main Office  [address]   [place], [place] [address]   Phone: [phone]  Fax: [phone]      December 06, 2009 MRN: [id number]   [facility] [address] [place], [place]  [address]   Dear Mr. COCKRELL,   We have scheduled the above patient for an appointment for a Cardiac MRI on 12/12/2009 at  3:00p.m.  Please refer to the below information for the location and instructions for this test:  Location:     [facility]       [address]       [place], [place]  [address] Instructions:    Wilmon Arms at [facility] Outpatient Registration 45 minutes prior to your appointment time.  This will ensure you are in the Radiology Department 30 minutes prior to your appointment.    There are no restrictions for this test you may eat and take medications as usual.  If you need to reschedule this appointment please call at the number listed above.  Sincerely,       Lorne Skeens  [facility] Scheduling Team

## 2010-06-04 NOTE — Progress Notes (Signed)
Summary: c/o headache  Phone Note Call from Patient Call back at Home Phone [phone] Call back at [phone] -cell phone    Caller: Spouse Erik Bird Reason for Call: Talk to Nurse, Lab or Test Results Complaint: Headache Summary of Call: per pt wife calling, wife wants to discuss if it's the medication.  Initial call taken by: Lorne Skeens,  Sep 25, 2009 8:04 AM  Follow-up for Phone Call        [facility] cell #/NA home # Erik Dung, RN, BSN  Sep 25, 2009 9:37 AM talked with wife Erik Bird--pt has had nagging ongoing headache for 3 days--pt denies lightheadedness /dizziness/vision problems/speech problems-pt saw Dr Annabell Howells yesterday 110/80--B/P 121/66 today by wife with B/P monitor--I reviewed with Dr Milda Smart not make any med changes for now and will schedule earlier phosp appt for  pt 09-27-09--I discussed with wife by telephone

## 2010-06-04 NOTE — Assessment & Plan Note (Signed)
Summary: 1 MO ROV /NWS   Vital Signs:  Patient profile:   50 year old male Height:      69 inches (175.26 cm) Weight:      192 pounds (87.27 kg) BMI:     28.46 O2 Sat:      95 % on Room air Temp:     98.6 degrees F (37.00 degrees C) oral Pulse rate:   58 / minute BP sitting:   122 / 68  (left arm) Cuff size:   regular  Vitals Entered By: Brenton Grills MA (December 28, 2009 4:00 PM)  O2 Flow:  Room air CC: 1 month F/U/aj Is Patient Diabetic? Yes   Primary Provider:  [place]  CC:  1 month F/U/aj.  History of Present Illness: pt declines pump and continuous monitor.  he brings a record of his cbg's which i have reviewed today.  he has 2/week, of cbg 50-70, before lunch and in the afternoon.  he says some of these episodes are due to being unable to predict physical activity, but physical activity does not explain all of these episodes.  Current Medications (verified): 1)  Effient 10 Mg Tabs (Prasugrel Hcl) .... Take One Tablet Once Daily 2)  Crestor 40 Mg Tabs (Rosuvastatin Calcium) .... Take One Tablet Once Daily 3)  Coreg 12.5 Mg Tabs (Carvedilol) .... One Twice A Day 4)  Spironolactone 25 Mg Tabs (Spironolactone) .... Take One Tablet Once Daily 5)  Enalapril Maleate 5 Mg Tabs (Enalapril Maleate) .... Take 1.5 Tablets Two Times A Day 6)  Vitamin E 400 Unit Caps (Vitamin E) .... 2 Capsules Daily 7)  Cinnamon Oil  Oil (Cassia Oil) .Marland Kitchen.. 1000 Two Times A Day 8)  Lantus 100 Unit/ml Soln (Insulin Glargine) .... 30 Units Daily, and 31g Syringes 9)  Novolog Flexpen 100 Unit/ml Soln (Insulin Aspart) .... Three Times A Day (Just Before Each Meal) 12-09-08 Units 10)  Aspirin Ec 325 Mg Tbec (Aspirin) .... Take One Tablet By Mouth Daily 11)  Tramadol Hcl 50 Mg Tabs (Tramadol Hcl) .... Take 1 or 2 Tablets Two Times A Day As Needed For Pain 12)  Fish Oil 1000 Mg Caps (Omega-3 Fatty Acids) .... One Twice A Day 13)  Nitrostat 0.4 Mg Subl (Nitroglycerin) .Marland Kitchen.. 1 Sl As Needed Chest Pain 14)   Garlic-Parsley 161-09 Mg Caps (Garlic-Parsley) .Marland Kitchen.. 1 Capsule Once Daily 15)  Bayer Breeze 2 Test  Disk (Glucose Blood) .... Three Times A Day, and Lancets 250.01 16)  Bd Pen Needle Short U/f 31g X 8 Mm Misc (Insulin Pen Needle) .... 4x A Day  Allergies (verified): 1)  ! Codeine  Past History:  Past Medical History: Last updated: 09/27/2009 1. Diabetes mellitus: on insulin 2. Ischemic cardiomyopathy: Echo (5/11) EF 30-35% with inferior and inferoseptal akinesis, posterior hypokinesis, mild MR, mild to moderately decreased RV systolic function, dilated IVC.  3. Coronary artery disease: Inferior STEMI 5/11 complicated by cardiac arrest requiring CPR.  LHC showed total occlusion proximal RCA, 60-70% proximal LAD at D1, and 60% ostial D1.  Patient had 2 overlapping PROMUS stents to the RCA.  4. Probably foley catheter trauma with hematuria (5/11) 5. Tobacco abuse 6.  Hyperlipidemia  Review of Systems  The patient denies syncope.     Impression & Recommendations:  Problem # 1:  DIAB W/O COMP TYPE II/UNS NOT STATED UNCNTRL (ICD-250.00) he needs some adjustment in his therapy  Medications Added to Medication List This Visit: 1)  Novolog Flexpen 100 Unit/ml Soln (Insulin  aspart) .... Three times a day (just before each meal) 11-08-11 units  Other Orders: Est. Patient Level III ([address])  Patient Instructions: 1)  check your blood sugar 2-3 times a day.  vary the time of day when you check, between before the 3 meals, and at bedtime.  also check if you have symptoms of your blood sugar being too high or too low.  please keep a record of the readings and bring it to your next appointment here.  please call us sooner if you are having low blood sugar episodes.  you should also check blood sugar after supper. 2)  continue lantus 30 units once daily. 3)  change novolog to (just before each meal) 11-08-11 units.  if exertion is anticipated, subtract 1 unit of novolog from that injection.  if blood  sugar is over 250, take 1 extra unit of novolog. 4)  Please schedule a follow-up appointment in 2-3 weeks.   Prescriptions: LANTUS 100 UNIT/ML SOLN (INSULIN GLARGINE) 30 units daily, and 31g syringes  #3 vials x 3   Entered and Authorized by:   Minus Breeding MD   Signed by:   Minus Breeding MD on 12/28/2009   Method used:   Electronically to        [facility] [address] [phone]* (retail)       [address]       [place], [place]  [address]       Ph: [phone] or [phone]       Fax: [phone]   RxID:   [id number]

## 2010-06-04 NOTE — Assessment & Plan Note (Signed)
Summary: 2-3 week follow up-lb   Vital Signs:  Patient profile:   50 year old male Height:      69 inches (175.26 cm) Weight:      198.50 pounds (90.23 kg) BMI:     29.42 O2 Sat:      94 % on Room air Temp:     98.4 degrees F (36.89 degrees C) oral Pulse rate:   69 / minute BP sitting:   138 / 74  (left arm) Cuff size:   regular  Vitals Entered By: Brenton Grills CMA (AAMA) (March 08, 2010 3:01 PM)  O2 Flow:  Room air CC: 3 week F/U/aj Is Patient Diabetic? Yes   Primary Provider:  Minus Breeding MD  CC:  3 week F/U/aj.  History of Present Illness: he brings a record of his cbg's which i have reviewed today. it varies from 250 (am) to 65 (before lunch and afternoon).  he takes lantus 55 units once daily, novolog 6 units three times a day (just before each meal), and as needed novolog as noted.    Current Medications (verified): 1)  Effient 10 Mg Tabs (Prasugrel Hcl) .... Take One Tablet Once Daily 2)  Crestor 40 Mg Tabs (Rosuvastatin Calcium) .... Take One Tablet Once Daily 3)  Coreg 12.5 Mg Tabs (Carvedilol) .... One Twice A Day 4)  Spironolactone 25 Mg Tabs (Spironolactone) .... Take One Tablet Once Daily 5)  Enalapril Maleate 5 Mg Tabs (Enalapril Maleate) .... Take 1.5 Tablets Two Times A Day 6)  Vitamin E 400 Unit Caps (Vitamin E) .... 2 Capsules Daily 7)  Cinnamon Oil  Oil (Cassia Oil) .Marland Kitchen.. 1000 Two Times A Day 8)  Novolog Flexpen 100 Unit/ml Soln (Insulin Aspart) .... As Needed Use 9)  Aspirin Ec 325 Mg Tbec (Aspirin) .... Take One Tablet By Mouth Daily 10)  Tramadol Hcl 50 Mg Tabs (Tramadol Hcl) .... Take 1 or 2 Tablets Two Times A Day As Needed For Pain 11)  Fish Oil 1000 Mg Caps (Omega-3 Fatty Acids) .... One Twice A Day 12)  Nitrostat 0.4 Mg Subl (Nitroglycerin) .Marland Kitchen.. 1 Sl As Needed Chest Pain 13)  Garlic-Parsley 875-64 Mg Caps (Garlic-Parsley) .Marland Kitchen.. 1 Capsule Once Daily 14)  Bayer Breeze 2 Test  Disk (Glucose Blood) .... Three Times A Day, and Lancets 250.01 15)   Bd Pen Needle Short U/f 31g X 8 Mm Misc (Insulin Pen Needle) .... 4x A Day 16)  Lantus Solostar 100 Unit/ml Soln (Insulin Glargine) .... 55 Units Each Am, and Pen Needles 4x A Day  Allergies (verified): 1)  ! Codeine  Past History:  Past Medical History: Last updated: 02/26/2010 1. Diabetes mellitus: on insulin.  Developed diabetes after a coxsackievirus infection.  2. Ischemic cardiomyopathy: Echo (5/11) EF 30-35% with inferior and inferoseptal akinesis, posterior hypokinesis, mild MR, mild to moderately decreased RV systolic function, dilated IVC.   Echo (6/11): EF 35-40%, inferior and inferoseptal akinesis, no significant valvular abnormalities.  Cardiac MRI (9/11): EF 42%, mildly dilated LV, mid to apical inferior and mid posterior 76-99% thickness subendocardial delayed enhancement.  3. Coronary artery disease: Inferior STEMI 5/11 complicated by cardiac arrest requiring CPR.  LHC showed total occlusion proximal RCA, 60-70% proximal LAD at D1, and 60% ostial D1.  Patient had 2 overlapping PROMUS stents to the RCA.  4. Probably foley catheter trauma with hematuria (5/11) 5. Tobacco abuse 6.  Hyperlipidemia 7. Carotid doppler (6/11): 0-39% bilateral ICA stenosis.   Review of Systems  The patient  denies syncope.    Physical Exam  General:  normal appearance.     Impression & Recommendations:  Problem # 1:  DIAB W/O COMP TYPE II/UNS NOT STATED UNCNTRL (ICD-250.00) Assessment Improved he needs some adjustment in his therapy  Medications Added to Medication List This Visit: 1)  Lantus Solostar 100 Unit/ml Soln (Insulin glargine) .... 65 units each am, and pen needles 4x a day  Other Orders: Est. Patient Level III ([address])  Patient Instructions: 1)  check your blood sugar 4 times a day--before the 3 meals, and at bedtime.  also check if you have symptoms of your blood sugar being too high or too low.  please keep a record of the readings and bring it to your next appointment  here.  please call us sooner if you are having low blood sugar episodes. 2)  increase lantus to 65 units each am.   3)  change novolog to only: 4)  4x a day (before meals, and at bedtime): 5)  < 200:  none 6)  200's:  2 units 7)  300's:  4 units 8)  over 400:  6 units. 9)  Please schedule a follow-up appointment in 1 month. Prescriptions: LANTUS SOLOSTAR 100 UNIT/ML SOLN (INSULIN GLARGINE) 65 units each am, and pen needles 4x a day  #2 boxes x 11   Entered and Authorized by:   Minus Breeding MD   Signed by:   Minus Breeding MD on 03/08/2010   Method used:   Print then Give to Patient   RxID:   [id number] LANTUS SOLOSTAR 100 UNIT/ML SOLN (INSULIN GLARGINE) 55 units each am, and pen needles 4x a day  #2 boxes x 11   Entered and Authorized by:   Minus Breeding MD   Signed by:   Minus Breeding MD on 03/08/2010   Method used:   Print then Give to Patient   RxID:   [id number]    Orders Added: 1)  Est. Patient Level III [[address]]

## 2010-06-04 NOTE — Progress Notes (Signed)
Summary: med question  Phone Note Call from Patient Call back at [phone]   Caller: Spouse/Janice Reason for Call: Talk to Nurse Summary of Call: request call back about meds, wondering if he can come off of some Initial call taken by: Migdalia Dk,  October 04, 2009 11:42 AM  Follow-up for Phone Call        talked with wife by telephone--pt has lost 10 pounds in 7 days--questioning if he should continue furosemide--will review with Dr Vito Berger with Dr Milda Smart decrease Lasix to 10mg  daily-wife states pt also has a poor appetite--she will weigh pt daily and call if pt continues to lose weight    New/Updated Medications: FUROSEMIDE 20 MG TABS (FUROSEMIDE) one-half  tablet daily

## 2010-06-04 NOTE — Progress Notes (Signed)
Summary: talk to nurse concerning order  Phone Note From Other Clinic   Caller: Stacy Summary of Call: Per [facility] has questions concerning an order that was sent to them for this patient. ofc [phone] Initial call taken by: Edman Circle,  Sep 25, 2009 1:14 PM  Follow-up for Phone Call        talked with Kennyth Arnold at neuro rehab--pt referred to them for out-pt PT for lower extremity weakness--I will review with Dr Shirlee Latch to confirm pt needs out pt PT in addition to cardiac rehab,which is already ordered and in process Luana Shu  I reviewed with Dr Pinchus Weckwerth--Cardiac Rehab had recommended out-pt PT for lower extremity weakness in conjunction with Cardiac Rehab--I talked with Christa at out-pt neuro rehab

## 2010-06-04 NOTE — Assessment & Plan Note (Signed)
Summary: FU Erik Bird   Vital Signs:  Patient profile:   50 year old male Height:      69 inches (175.26 cm) Weight:      192.25 pounds (87.39 kg) BMI:     28.49 O2 Sat:      98 % on Room air Temp:     97.1 degrees F (36.17 degrees C) oral Pulse rate:   59 / minute BP sitting:   128 / 70  (left arm) Cuff size:   regular  Vitals Entered By: Brenton Grills MA (February 15, 2010 2:57 PM)  O2 Flow:  Room air CC: F/U appt/refill for Lantus/aj Is Patient Diabetic? Yes   CC:  F/U appt/refill for Lantus/aj.  History of Present Illness: he brings a record of his cbg's which i have reviewed today.  he checks only before breakfast, and at hs.  almost all are 200-380.  wife says he is less active, now that the weather is cooler, and he is less active. pt states few mos of intermittent moderate pain at the left hip, but no assoc numbness.  Current Medications (verified): 1)  Effient 10 Mg Tabs (Prasugrel Hcl) .... Take One Tablet Once Daily 2)  Crestor 40 Mg Tabs (Rosuvastatin Calcium) .... Take One Tablet Once Daily 3)  Coreg 12.5 Mg Tabs (Carvedilol) .... One Twice A Day 4)  Spironolactone 25 Mg Tabs (Spironolactone) .... Take One Tablet Once Daily 5)  Enalapril Maleate 5 Mg Tabs (Enalapril Maleate) .... Take 1.5 Tablets Two Times A Day 6)  Vitamin E 400 Unit Caps (Vitamin E) .... 2 Capsules Daily 7)  Cinnamon Oil  Oil (Cassia Oil) .Marland Kitchen.. 1000 Two Times A Day 8)  Lantus 100 Unit/ml Soln (Insulin Glargine) .... 30 Units Daily, and 31g Syringes 9)  Novolog Flexpen 100 Unit/ml Soln (Insulin Aspart) .... Three Times A Day (Just Before Each Meal) 11-08-11 Units 10)  Aspirin Ec 325 Mg Tbec (Aspirin) .... Take One Tablet By Mouth Daily 11)  Tramadol Hcl 50 Mg Tabs (Tramadol Hcl) .... Take 1 or 2 Tablets Two Times A Day As Needed For Pain 12)  Fish Oil 1000 Mg Caps (Omega-3 Fatty Acids) .... One Twice A Day 13)  Nitrostat 0.4 Mg Subl (Nitroglycerin) .Marland Kitchen.. 1 Sl As Needed Chest Pain 14)  Garlic-Parsley  100-50 Mg Caps (Garlic-Parsley) .Marland Kitchen.. 1 Capsule Once Daily 15)  Bayer Breeze 2 Test  Disk (Glucose Blood) .... Three Times A Day, and Lancets 250.01 16)  Bd Pen Needle Short U/f 31g X 8 Mm Misc (Insulin Pen Needle) .... 4x A Day  Allergies (verified): 1)  ! Codeine  Past History:  Past Medical History: Last updated: 09/27/2009 1. Diabetes mellitus: on insulin 2. Ischemic cardiomyopathy: Echo (5/11) EF 30-35% with inferior and inferoseptal akinesis, posterior hypokinesis, mild MR, mild to moderately decreased RV systolic function, dilated IVC.  3. Coronary artery disease: Inferior STEMI 5/11 complicated by cardiac arrest requiring CPR.  LHC showed total occlusion proximal RCA, 60-70% proximal LAD at D1, and 60% ostial D1.  Patient had 2 overlapping PROMUS stents to the RCA.  4. Probably foley catheter trauma with hematuria (5/11) 5. Tobacco abuse 6.  Hyperlipidemia  Review of Systems  The patient denies hypoglycemia.         denies rash at the left hip  Physical Exam  General:  normal appearance.   Msk:  gait is normal and steady left hip is nontender   Impression & Recommendations:  Problem # 1:  DIAB W/O COMP  TYPE II/UNS NOT STATED UNCNTRL (ICD-250.00) he may do better with a simpler regimen  Problem # 2:  HIP PAIN, LEFT (ICD-719.45) uncertain etiology  Medications Added to Medication List This Visit: 1)  Novolog Flexpen 100 Unit/ml Soln (Insulin aspart) .... As needed use 2)  Tramadol Hcl 50 Mg Tabs (Tramadol hcl) .... Take 1 or 2 tablets two times a day as needed for pain 3)  Lantus Solostar 100 Unit/ml Soln (Insulin glargine) .... 55 units each am, and pen needles 4x a day  Other Orders: Rheumatology Referral (Rheumatology) TLB-B12, Serum-Total ONLY ([address]-U04) Est. Patient Level IV (54098)  Patient Instructions: 1)  check your blood sugar 4 times a day--before the 3 meals, and at bedtime.  also check if you have symptoms of your blood sugar being too high or too  low.  please keep a record of the readings and bring it to your next appointment here.  please call us sooner if you are having low blood sugar episodes. 2)  increase lantus to 55 units each am.   3)  change novolog to 4x a day (before meals, and at bedtime): 4)  < 200:  none 5)  200's:  2 units 6)  300's:  4 units 7)  over 400:  6 units. 8)  Please schedule a follow-up appointment in 2-3 weeks.   9)  refer rheumatology.  you will be called with a day and time for an appointment. 10)  blood tests are being ordered for you today.  please call [phone] to hear your test results. Prescriptions: LANTUS SOLOSTAR 100 UNIT/ML SOLN (INSULIN GLARGINE) 55 units each am, and pen needles 4x a day  #2 boxes x 11   Entered and Authorized by:   Minus Breeding MD   Signed by:   Minus Breeding MD on 02/15/2010   Method used:   Electronically to        [facility] [address] [phone]* (retail)       [address]       [place], [place]  [address]       Ph: [phone] or [phone]       Fax: [phone]   RxID:   [id number] TRAMADOL HCL 50 MG TABS (TRAMADOL HCL) take 1 or 2 tablets two times a day as needed for pain  #50 x 11   Entered and Authorized by:   Minus Breeding MD   Signed by:   Minus Breeding MD on 02/15/2010   Method used:   Electronically to        [facility] [address] [phone]* (retail)       [address]       [place], [place]  [address]       Ph: [phone] or [phone]       Fax: [phone]   RxID:   [id number]

## 2010-06-04 NOTE — Assessment & Plan Note (Signed)
Summary: 1 month rov echo/carotid starting at 9am/sl   Primary Provider:  [place]  CC:  Doing ok..  History of Present Illness: 50 yo with history of diabetes was admitted to [facility] in 5/11 with inferior STEMI. Patient had collapsed at home and got CPR from his son and EMS for cardiac arrest.  He had a perfusing rhythm in the ER.  Left heart cath showed a totally occluded RCA which was treated with 2 PROMUS drug-eluting stents.  Echo showed EF 30-35% with inferior and inferoseptal akinesis and RV dysfunction.  Patient's course was complicated by chest pain from broken ribs (CPR) and probable foley catheter trauma with hematuria and dysuria after the catheter was removed.   Since last appointment, patient is doing better.  He still has some soreness in his chest where his ribs were broken by CPR, but this is getting beter.  He has done well with cardiac rehab.  He has no chest pain with exertion.  He is getting stronger and wants to go back to work.  No exertional dyspnea.  He has been able to stay off cigarettes.  Weight is down 8 lbs since last appointment.   Labs (5/11): K 4.5, creatinine 1.23, BNP 158, LDL 119, HDL 41, BNP 402  Current Medications (verified): 1)  Effient 10 Mg Tabs (Prasugrel Hcl) .... Take One Tablet Once Daily 2)  Crestor 40 Mg Tabs (Rosuvastatin Calcium) .... Take One Tablet Once Daily 3)  Carvedilol 6.25 Mg Tabs (Carvedilol) .... Take One Tablet Two Times A Day 4)  Spironolactone 25 Mg Tabs (Spironolactone) .... Take One Tablet Once Daily 5)  Enalapril Maleate 5 Mg Tabs (Enalapril Maleate) .... Take 1.5 Tablets Two Times A Day 6)  Vitamin E 400 Unit Caps (Vitamin E) .... 2 Capsules Daily 7)  Cinnamon Oil  Oil (Cassia Oil) .Marland Kitchen.. 1000 Two Times A Day 8)  Lantus 100 Unit/ml Soln (Insulin Glargine) .... 30 Units Daily 9)  Novolog 100 Unit/ml Soln (Insulin Aspart) .... Sliding Scale 10)  Novolog Flexpen 100 Unit/ml Soln (Insulin Aspart) .... Sliding Scale 11)   Aspirin Ec 325 Mg Tbec (Aspirin) .... Take One Tablet By Mouth Daily 12)  Tramadol Hcl 50 Mg Tabs (Tramadol Hcl) .... Take 1 or 2 Tablets Two Times A Day As Needed For Pain 13)  Furosemide 20 Mg Tabs (Furosemide) .... One-Half  Tablet Daily  Allergies (verified): 1)  ! Codeine  Past History:  Past Medical History: Reviewed history from 09/27/2009 and no changes required. 1. Diabetes mellitus: on insulin 2. Ischemic cardiomyopathy: Echo (5/11) EF 30-35% with inferior and inferoseptal akinesis, posterior hypokinesis, mild MR, mild to moderately decreased RV systolic function, dilated IVC.  3. Coronary artery disease: Inferior STEMI 5/11 complicated by cardiac arrest requiring CPR.  LHC showed total occlusion proximal RCA, 60-70% proximal LAD at D1, and 60% ostial D1.  Patient had 2 overlapping PROMUS stents to the RCA.  4. Probably foley catheter trauma with hematuria (5/11) 5. Tobacco abuse 6.  Hyperlipidemia  Family History: Reviewed history from 09/27/2009 and no changes required. Notable for a father with a myocardial infarction in his 73s.   Social History: Reviewed history from 09/27/2009 and no changes required. The patient lives in [place] with his wife and son.  He has a 1-pack-per-day history of smoking for approximately 30 years.  He denies any alcohol or drug use. Works in [employer].   Review of Systems       All systems reviewed and negative except  as per HPI.   Vital Signs:  Patient profile:   50 year old male Height:      69 inches Weight:      173 pounds (78.64 kg) BMI:     25.64 Pulse rate:   64 / minute Pulse rhythm:   regular BP sitting:   132 / 74  (left arm) Cuff size:   regular  Vitals Entered By: Judithe Modest CMA (November 01, 2009 10:06 AM)  Physical Exam  General:  Well developed, well nourished, in no acute distress. Neck:  Neck supple, no JVD. No masses, thyromegaly or abnormal cervical nodes. Lungs:  Clear bilaterally to auscultation  and percussion. Heart:  Non-displaced PMI, chest non-tender; regular rate and rhythm, S1, S2 without murmurs, rubs or gallops. Carotid upstroke normal, left carotid bruit.  Pedals normal pulses. No edema.  Abdomen:  Bowel sounds positive; abdomen soft and non-tender without masses, organomegaly, or hernias noted. No hepatosplenomegaly. Extremities:  No clubbing or cyanosis. Neurologic:  Alert and oriented x 3. Psych:  Normal affect.   Impression & Recommendations:  Problem # 1:  CORONARY ATHEROSCLEROSIS NATIVE CORONARY ARTERY (ICD-414.01) Stable s/p inferior STEMI with RCA PCI.  He will need prasugrel for at least a year as well as ASA. Continue Crestor, Coreg, ACEI.  Patient has a residual moderate LAD stenosis. He has done wel with cardiac rehab and wants to return to work.  I think that it would be reasonable to go back next week.   Problem # 2:  CHRONIC SYSTOLIC HEART FAILURE (ICD-428.22) Patient appears euvolemic on exam and denies exertional dyspnea.  I will have him increase Coreg to 9.375 mg two times a day x 1 week then up to 12.5 mg two times a day. He will continue same doses of spironolactone and enalapril.  It will be ok for him to stop Lasix. BMP/BNP today.   Problem # 3:  CAROTID ARTERY STENOSIS (ICD-433.10) Left carotid bruit (soft), will get carotid dopplers.   Problem # 4:  HYPERLIPIDEMIA-MIXED (ICD-272.4) Continue Crestor, will also have him start fish oil 2000 mg daily.   Other Orders: TLB-BMP (Basic Metabolic Panel-BMET) (80048-METABOL) TLB-BNP (B-Natriuretic Peptide) (83880-BNPR) Primary Care Referral (Primary) Echocardiogram (Echo)  Patient Instructions: 1)  Your physician has recommended you make the following change in your medication:  2)  Stop Lasix(furosemide) 3)  Increase Coreg(carvedilol) to 9.375 mg twice a day for 1 week--this will be one and one-half of a 6.25mg  tablet twice a day--after one week increase Coreg to 12.5mg  twice a day 4)  Start Fish Oil  to 1000mg  twice a day 5)  Your physician recommends that you have lab today--BMP/BNP 414.01 428.22 6)  You can stop Cardiac Rehab. 7)  Your physician has requested that you have an echocardiogram.  Echocardiography is a painless test that uses sound waves to create images of your heart. It provides your doctor with information about the size and shape of your heart and how well your heart's chambers and valves are working.  This procedure takes approximately one hour. There are no restrictions for this procedure. MID AUGUST 2011 8)  Your physician recommends that you schedule a follow-up appointment with Dr Shirlee Latch the first of September after you have the echo done Prescriptions: ENALAPRIL MALEATE 5 MG TABS (ENALAPRIL MALEATE) take 1.5 tablets two times a day  #180 x 3   Entered by:   Katina Dung, RN, BSN   Authorized by:   Marca Ancona, MD   Signed by:  Katina Dung, RN, BSN on 11/01/2009   Method used:   Electronically to        [facility] [phone]* (retail)       [address]       [place], [place]  [address]       Ph: [phone]       Fax: [phone]   RxID:   [id number] SPIRONOLACTONE 25 MG TABS (SPIRONOLACTONE) take one tablet once daily  #90 x 3   Entered by:   Katina Dung, RN, BSN   Authorized by:   Marca Ancona, MD   Signed by:   Katina Dung, RN, BSN on 11/01/2009   Method used:   Electronically to        [facility] [phone]* (retail)       [address]       [place], [place]  [address]       Ph: [phone]       Fax: [phone]   RxID:   [id number] COREG 12.5 MG TABS (CARVEDILOL) one twice a day  #180 x 3   Entered by:   Katina Dung, RN, BSN   Authorized by:   Marca Ancona, MD   Signed by:   Katina Dung, RN, BSN on 11/01/2009   Method used:   Electronically to        [facility] [phone]* (retail)       [address]       [place], [place]  [address]       Ph: [phone]       Fax: [phone]   RxID:    [id number]

## 2010-06-06 NOTE — Assessment & Plan Note (Signed)
Summary: 1 MTH FU  STC   Vital Signs:  Patient profile:   50 year old male Height:      69 inches (175.26 cm) Weight:      202.50 pounds (92.05 kg) BMI:     30.01 O2 Sat:      95 % on Room air Temp:     99.2 degrees F (37.33 degrees C) oral Pulse rate:   63 / minute BP sitting:   118 / 72  (left arm) Cuff size:   regular  Vitals Entered By: Brenton Grills CMA Duncan Dull) (April 18, 2010 4:31 PM)  O2 Flow:  Room air CC: Follow-up visit/aj Is Patient Diabetic? Yes   Primary Moris Ratchford:  Minus Breeding MD  CC:  Follow-up visit/aj.  History of Present Illness: he brings a record of his cbg's which i have reviewed today.  it varies from 60-200's.  it is highest in am and hs, and lowest with exertion.  most are in the 100's.  he takes humalog 0-8 units total per day.    Current Medications (verified): 1)  Effient 10 Mg Tabs (Prasugrel Hcl) .... Take One Tablet Once Daily 2)  Crestor 40 Mg Tabs (Rosuvastatin Calcium) .... Take One Tablet Once Daily 3)  Coreg 12.5 Mg Tabs (Carvedilol) .... One Twice A Day 4)  Spironolactone 25 Mg Tabs (Spironolactone) .... Take One Tablet Once Daily 5)  Enalapril Maleate 5 Mg Tabs (Enalapril Maleate) .... Take 1.5 Tablets Two Times A Day 6)  Vitamin E 400 Unit Caps (Vitamin E) .... 2 Capsules Daily 7)  Cinnamon Oil  Oil (Cassia Oil) .Marland Kitchen.. 1000 Two Times A Day 8)  Novolog Flexpen 100 Unit/ml Soln (Insulin Aspart) .... As Needed Use 9)  Aspirin Ec 325 Mg Tbec (Aspirin) .... Take One Tablet By Mouth Daily 10)  Tramadol Hcl 50 Mg Tabs (Tramadol Hcl) .... Take 1 or 2 Tablets Two Times A Day As Needed For Pain 11)  Fish Oil 1000 Mg Caps (Omega-3 Fatty Acids) .... One Twice A Day 12)  Nitrostat 0.4 Mg Subl (Nitroglycerin) .Marland Kitchen.. 1 Sl As Needed Chest Pain 13)  Garlic-Parsley 308-65 Mg Caps (Garlic-Parsley) .Marland Kitchen.. 1 Capsule Once Daily 14)  Lantus Solostar 100 Unit/ml Soln (Insulin Glargine) .... 65 Units Each Am, and Pen Needles 4x A Day 15)  Onetouch Ultra Mini  W/device Kit (Blood Glucose Monitoring Suppl) .... As Dir 16)  Onetouch Ultra Blue  Strp (Glucose Blood) .... 4x A Day, and Lancets 250.03.  Variable Glucoses.  Allergies (verified): 1)  ! Codeine  Past History:  Past Medical History: Last updated: 02/26/2010 1. Diabetes mellitus: on insulin.  Developed diabetes after a coxsackievirus infection.  2. Ischemic cardiomyopathy: Echo (5/11) EF 30-35% with inferior and inferoseptal akinesis, posterior hypokinesis, mild MR, mild to moderately decreased RV systolic function, dilated IVC.   Echo (6/11): EF 35-40%, inferior and inferoseptal akinesis, no significant valvular abnormalities.  Cardiac MRI (9/11): EF 42%, mildly dilated LV, mid to apical inferior and mid posterior 76-99% thickness subendocardial delayed enhancement.  3. Coronary artery disease: Inferior STEMI 5/11 complicated by cardiac arrest requiring CPR.  LHC showed total occlusion proximal RCA, 60-70% proximal LAD at D1, and 60% ostial D1.  Patient had 2 overlapping PROMUS stents to the RCA.  4. Probably foley catheter trauma with hematuria (5/11) 5. Tobacco abuse 6.  Hyperlipidemia 7. Carotid doppler (6/11): 0-39% bilateral ICA stenosis.   Review of Systems  The patient denies syncope.    Physical Exam  General:  normal appearance.   Psych:  Alert and cooperative; normal mood and affect; normal attention span and concentration.     Impression & Recommendations:  Problem # 1:  DIAB W/O COMP TYPE II/UNS NOT STATED UNCNTRL (ICD-250.00) Assessment Improved  Medications Added to Medication List This Visit: 1)  Lantus Solostar 100 Unit/ml Soln (Insulin glargine) .... 55 units each am, and 10 units in the evening, and pen needles 4x a day  Other Orders: Est. Patient Level III (63875)  Patient Instructions: 1)  check your blood sugar 4 times a day--before the 3 meals, and at bedtime.  also check if you have symptoms of your blood sugar being too high or too low.  please keep a  record of the readings and bring it to your next appointment here.  please call us sooner if you are having low blood sugar episodes. 2)  change lantus to 55 units each am, and 10 units in the evening.   3)  change novolog to only: 4)  4x a day (before meals, and at bedtime): 5)  < 200:  none 6)  200's:  2 units 7)  300's:  4 units 8)  over 400:  6 units. 9)  Please schedule a follow-up appointment in 2 months. Prescriptions: LANTUS SOLOSTAR 100 UNIT/ML SOLN (INSULIN GLARGINE) 55 units each am, and 10 units in the evening, and pen needles 4x a day  #2 boxes x 11   Entered and Authorized by:   Minus Breeding MD   Signed by:   Minus Breeding MD on 04/20/2010   Method used:   Electronically to        [facility]  [address] [phone]* (retail)       [address]       [place], [place]  [address]       Ph: [phone]       Fax: [phone]   RxID:   [id number]    Orders Added: 1)  Est. Patient Level III [28315]

## 2010-06-06 NOTE — Progress Notes (Signed)
Summary: discuss med  Phone Note Call from Patient Call back at Home Phone [phone]   Caller: [phone] Reason for Call: Talk to Nurse Details for Reason: per pt wife calling back to see if dr. Shirlee Latch decide on her hubsand meds Initial call taken by: Lorne Skeens,  May 16, 2010 11:30 AM  Follow-up for Phone Call        05/16/10--12noon--wife calling stating would like to know if dr Shirlee Latch had decided which meds her husband needs to take and which could be obtained for cheaper co-pay--advised mr Ivery needs to stay on effient and we will change crestor to generic lipitor--i gave her samples of effient and sent rx to [facility] [address] for generic lipitor--was will pic up at front desk--all discount cards in bag with effient Follow-up by: Ledon Snare, RN,  May 16, 2010 12:01 PM

## 2010-06-06 NOTE — Progress Notes (Signed)
Summary: pt's wife has questions re med  Phone Note Call from Patient Call back at [phone]   Caller: [phone] [facility] Reason for Call: Talk to Nurse Summary of Call: pt on crestor and effient which are about 200 a month, and they can't afford that, can change meds? Initial call taken by: Glynda Jaeger,  May 14, 2010 11:51 AM  Follow-up for Phone Call        RN s/w Pt's wife re:the need to change meds due to changes in insurance. Pt's wife- Liborio Nixon states Crestor cost $147/ 30 pills; Effient $189/30 pills. Would like to discuss alternate medications. RN advised Dr Shirlee Latch will review when he returns to the office on tomorrow, Wed 05/15/2010. Pt's wife verbalizes understanding.  Bernita Raisin, RN, BSN,  May 14, 2010 11:56 AM     Appended Document: pt's wife has questions re med He needs to stay on Effient.  Can we make sure he has a copay card and is taking advantage of all the rebates that the company offers? He can switch to Lipitor 80 mg daily if that would be cheaper than Crestor.

## 2010-06-06 NOTE — Progress Notes (Signed)
Summary: needs medication changed  Phone Note Call from Patient   Caller: Spouse [phone] Reason for Call: Talk to Nurse Summary of Call: lipitor not on formulary-can change to pravastatin? needs pharmacy changed to [facility] [address] Initial call taken by: Glynda Jaeger,  May 20, 2010 10:08 AM  Follow-up for Phone Call        [facility]  pt's wife rtn call -pls call [phone] Glynda Jaeger  May 20, 2010 3:03 PM   Pt wife calling backl regarding pt medication Judie Grieve  May 21, 2010 9:07 AM  Additional Follow-up for Phone Call Additional follow up Details #1::        Per pharmacy pt's insurance will not allow them to use the 4$ co payment card and generic will cost them $116.   Will need to address with Dr Shirlee Latch.  [facility] [address] Additional Follow-up by: Charolotte Capuchin, RN,  May 21, 2010 9:12 AM     Appended Document: needs medication changed We need to talk to this patient's insurance company.  He has a clear indication for Lipitor 80 mg daily or Crestor 40 mg daily. Pravastatin is clearly inadequate in his current situation.  We need to try to contact his insurance to get approval for one of these meds.  If he has to change to anything, would need to be Zocor 40.   Appended Document: needs medication changed PT'S WIFE AWARE./CY

## 2010-07-02 ENCOUNTER — Encounter: Payer: Self-pay | Admitting: Endocrinology

## 2010-07-02 ENCOUNTER — Other Ambulatory Visit: Payer: Self-pay | Admitting: Endocrinology

## 2010-07-02 ENCOUNTER — Ambulatory Visit (INDEPENDENT_AMBULATORY_CARE_PROVIDER_SITE_OTHER): Payer: 59 | Admitting: Endocrinology

## 2010-07-02 ENCOUNTER — Other Ambulatory Visit: Payer: 59

## 2010-07-02 DIAGNOSIS — E119 Type 2 diabetes mellitus without complications: Secondary | ICD-10-CM

## 2010-07-11 NOTE — Assessment & Plan Note (Signed)
Summary: PER WIFE END OF FEB APPT--STC   Vital Signs:  Patient profile:   50 year old male Height:      69 inches (175.26 cm) Weight:      206 pounds (93.64 kg) BMI:     30.53 O2 Sat:      95 % on Room air Temp:     98.7 degrees F (37.06 degrees C) oral Pulse rate:   80 / minute Resp:     16 per minute BP sitting:   150 / 78  (left arm) Cuff size:   large  Vitals Entered By: Burnard Leigh CMA(AAMA) (July 02, 2010 4:20 PM)  O2 Flow:  Room air CC: F/U recheck blood sugar levels/sls,cma Is Patient Diabetic? Yes   Primary Provider:  Minus Breeding MD  CC:  F/U recheck blood sugar levels/sls and cma.  History of Present Illness: pt states he feels well in general.  he brings an extensive record of his cbg's which i have reviewed today.  it varies from 100 (lunch) to 200's (am).    Current Medications (verified): 1)  Effient 10 Mg Tabs (Prasugrel Hcl) .... Take One Tablet Once Daily 2)  Coreg 12.5 Mg Tabs (Carvedilol) .... One Twice A Day 3)  Spironolactone 25 Mg Tabs (Spironolactone) .... Take One Tablet Once Daily 4)  Enalapril Maleate 5 Mg Tabs (Enalapril Maleate) .... Take 1.5 Tablets Two Times A Day 5)  Vitamin E 400 Unit Caps (Vitamin E) .... 2 Capsules Daily 6)  Cinnamon Oil  Oil (Cassia Oil) .Marland Kitchen.. 1000 Two Times A Day 7)  Novolog Flexpen 100 Unit/ml Soln (Insulin Aspart) .... As Needed Use 8)  Aspirin Ec 325 Mg Tbec (Aspirin) .... Take One Tablet By Mouth Daily 9)  Tramadol Hcl 50 Mg Tabs (Tramadol Hcl) .... Take 1 or 2 Tablets Two Times A Day As Needed For Pain 10)  Fish Oil 1000 Mg Caps (Omega-3 Fatty Acids) .... One Twice A Day 11)  Nitrostat 0.4 Mg Subl (Nitroglycerin) .Marland Kitchen.. 1 Sl As Needed Chest Pain 12)  Garlic-Parsley 161-09 Mg Caps (Garlic-Parsley) .Marland Kitchen.. 1 Capsule Once Daily 13)  Lantus Solostar 100 Unit/ml Soln (Insulin Glargine) .... 55 Units Each Am, and 10 Units in The Evening, and Pen Needles 4x A Day 14)  Onetouch Ultra Mini W/device Kit (Blood Glucose  Monitoring Suppl) .... As Dir 15)  Onetouch Ultra Blue  Strp (Glucose Blood) .... 4x A Day, and Lancets 250.03.  Variable Glucoses. 16)  Simvastatin 40 Mg Tabs (Simvastatin) .Marland Kitchen.. 1 Once Daily  Allergies (verified): 1)  ! Codeine  Past History:  Past Medical History: Last updated: 02/26/2010 1. Diabetes mellitus: on insulin.  Developed diabetes after a coxsackievirus infection.  2. Ischemic cardiomyopathy: Echo (5/11) EF 30-35% with inferior and inferoseptal akinesis, posterior hypokinesis, mild MR, mild to moderately decreased RV systolic function, dilated IVC.   Echo (6/11): EF 35-40%, inferior and inferoseptal akinesis, no significant valvular abnormalities.  Cardiac MRI (9/11): EF 42%, mildly dilated LV, mid to apical inferior and mid posterior 76-99% thickness subendocardial delayed enhancement.  3. Coronary artery disease: Inferior STEMI 5/11 complicated by cardiac arrest requiring CPR.  LHC showed total occlusion proximal RCA, 60-70% proximal LAD at D1, and 60% ostial D1.  Patient had 2 overlapping PROMUS stents to the RCA.  4. Probably foley catheter trauma with hematuria (5/11) 5. Tobacco abuse 6.  Hyperlipidemia 7. Carotid doppler (6/11): 0-39% bilateral ICA stenosis.   Review of Systems  The patient denies hypoglycemia.  Physical Exam  General:  normal appearance.   Pulses:  dorsalis pedis intact bilat.    Extremities:  no deformity.  no ulcer on the feet.  feet are of normal color and temp.  no edema  Neurologic:  sensation is intact to touch on the feet  Additional Exam:  Hemoglobin A1C       [H]  9.6 %     Impression & Recommendations:  Problem # 1:  DIAB W/O COMP TYPE II/UNS NOT STATED UNCNTRL (ICD-250.00) cbg is too high at breakfast, and the novolog is lowering his lunch cbg too much.    Medications Added to Medication List This Visit: 1)  Lantus 100 Unit/ml Soln (Insulin glargine) .... 55 units each am, and 12 in the evening, and syringes two times a  day  Other Orders: TLB-A1C / Hgb A1C (Glycohemoglobin) (83036-A1C) Est. Patient Level III (98119)  Patient Instructions: 1)  check your blood sugar 4 times a day--before the 3 meals, and at bedtime.  also check if you have symptoms of your blood sugar being too high or too low.  please keep a record of the readings and bring it to your next appointment here.  please call us sooner if you are having low blood sugar episodes. 2)  blood tests are being ordered for you today.  please call [phone] to hear your test results. 3)  pending the test results, please increase lantus to 55 units each am, and 12 units in the evening.   4)  change novolog to only: 5)  4x a day (before meals, and at bedtime): 6)  < 300:  none. 7)  300's:  2 units. 8)  over 400:  4 units. 9)  Please schedule a follow-up appointment in 3 months. 10)  (update: i left message on phone-tree:  rx as we discussed) Prescriptions: LANTUS 100 UNIT/ML SOLN (INSULIN GLARGINE) 55 units each am, and 12 in the evening, and syringes two times a day  #6 vials x 3   Entered and Authorized by:   Minus Breeding MD   Signed by:   Minus Breeding MD on 07/02/2010   Method used:   Print then Give to Patient   RxID:   [id number] LANTUS 100 UNIT/ML SOLN (INSULIN GLARGINE) 55 units each am, and 12 in the evening, and syringes two times a day  #2 vials x 11   Entered and Authorized by:   Minus Breeding MD   Signed by:   Minus Breeding MD on 07/02/2010   Method used:   Print then Give to Patient   RxID:   [id number] LANTUS 100 UNIT/ML SOLN (INSULIN GLARGINE) 55 units each am, and 12 in the evening, and syringes two times a day  #2 vials x 11   Entered and Authorized by:   Minus Breeding MD   Signed by:   Minus Breeding MD on 07/02/2010   Method used:   Print then Give to Patient   RxID:   [id number]    Orders Added: 1)  TLB-A1C / Hgb A1C (Glycohemoglobin) [83036-A1C] 2)  Est. Patient Level III [87564]

## 2010-07-18 LAB — BASIC METABOLIC PANEL
CO2: 29 mEq/L (ref 19–32)
Calcium: 8.9 mg/dL (ref 8.4–10.5)
Chloride: 102 mEq/L (ref 96–112)
Creatinine, Ser: 1.06 mg/dL (ref 0.4–1.5)
Potassium: 4.7 mEq/L (ref 3.5–5.1)

## 2010-07-22 LAB — BASIC METABOLIC PANEL
BUN: 15 mg/dL (ref 6–23)
BUN: 16 mg/dL (ref 6–23)
BUN: 20 mg/dL (ref 6–23)
BUN: 21 mg/dL (ref 6–23)
BUN: 26 mg/dL — ABNORMAL HIGH (ref 6–23)
BUN: 26 mg/dL — ABNORMAL HIGH (ref 6–23)
CO2: 20 mEq/L (ref 19–32)
CO2: 24 mEq/L (ref 19–32)
CO2: 25 mEq/L (ref 19–32)
CO2: 26 mEq/L (ref 19–32)
Calcium: 6.9 mg/dL — ABNORMAL LOW (ref 8.4–10.5)
Calcium: 7.5 mg/dL — ABNORMAL LOW (ref 8.4–10.5)
Calcium: 7.9 mg/dL — ABNORMAL LOW (ref 8.4–10.5)
Calcium: 8.2 mg/dL — ABNORMAL LOW (ref 8.4–10.5)
Calcium: 8.4 mg/dL (ref 8.4–10.5)
Calcium: 8.4 mg/dL (ref 8.4–10.5)
Chloride: 101 mEq/L (ref 96–112)
Chloride: 109 mEq/L (ref 96–112)
Chloride: 112 mEq/L (ref 96–112)
Creatinine, Ser: 1.05 mg/dL (ref 0.4–1.5)
Creatinine, Ser: 1.13 mg/dL (ref 0.4–1.5)
GFR calc Af Amer: >60 mL/min (ref >60)
GFR calc Af Amer: >60 mL/min (ref >60)
GFR calc Af Amer: >60 mL/min (ref >60)
GFR calc non Af Amer: 59 mL/min — ABNORMAL LOW (ref >60)
GFR calc non Af Amer: >60 mL/min (ref >60)
GFR calc non Af Amer: >60 mL/min (ref >60)
GFR calc non Af Amer: >60 mL/min (ref >60)
GFR calc non Af Amer: >60 mL/min (ref >60)
GFR calc non Af Amer: >60 mL/min (ref >60)
Glucose, Bld: 173 mg/dL — ABNORMAL HIGH (ref 70–99)
Glucose, Bld: 219 mg/dL — ABNORMAL HIGH (ref 70–99)
Glucose, Bld: 294 mg/dL — ABNORMAL HIGH (ref 70–99)
Glucose, Bld: 344 mg/dL — ABNORMAL HIGH (ref 70–99)
Potassium: 4.2 mEq/L (ref 3.5–5.1)
Potassium: 4.3 mEq/L (ref 3.5–5.1)
Potassium: 4.3 mEq/L (ref 3.5–5.1)
Potassium: 4.5 mEq/L (ref 3.5–5.1)
Potassium: 4.5 mEq/L (ref 3.5–5.1)
Sodium: 133 mEq/L — ABNORMAL LOW (ref 135–145)
Sodium: 135 mEq/L (ref 135–145)
Sodium: 135 mEq/L (ref 135–145)
Sodium: 135 mEq/L (ref 135–145)
Sodium: 137 mEq/L (ref 135–145)
Sodium: 137 mEq/L (ref 135–145)
Sodium: 140 mEq/L (ref 135–145)

## 2010-07-22 LAB — URINE MICROSCOPIC-ADD ON

## 2010-07-22 LAB — CBC
HCT: 36.4 % — ABNORMAL LOW (ref 39.0–52.0)
HCT: 38.3 % — ABNORMAL LOW (ref 39.0–52.0)
HCT: 40.8 % (ref 39.0–52.0)
Hemoglobin: 12.3 g/dL — ABNORMAL LOW (ref 13.0–17.0)
Hemoglobin: 12.8 g/dL — ABNORMAL LOW (ref 13.0–17.0)
Hemoglobin: 13.5 g/dL (ref 13.0–17.0)
MCHC: 35.2 g/dL (ref 30.0–36.0)
MCHC: 35.5 g/dL (ref 30.0–36.0)
MCV: 97.4 fL (ref 78.0–100.0)
MCV: 98 fL (ref 78.0–100.0)
MCV: 98.1 fL (ref 78.0–100.0)
MCV: 98.8 fL (ref 78.0–100.0)
Platelets: 162 10*3/uL (ref 150–400)
Platelets: 216 10*3/uL (ref 150–400)
Platelets: 244 10*3/uL (ref 150–400)
RBC: 3.55 MIL/uL — ABNORMAL LOW (ref 4.22–5.81)
RBC: 3.7 MIL/uL — ABNORMAL LOW (ref 4.22–5.81)
RBC: 3.9 MIL/uL — ABNORMAL LOW (ref 4.22–5.81)
RBC: 4.16 MIL/uL — ABNORMAL LOW (ref 4.22–5.81)
RDW: 13.6 % (ref 11.5–15.5)
RDW: 13.8 % (ref 11.5–15.5)
WBC: 11.1 10*3/uL — ABNORMAL HIGH (ref 4.0–10.5)
WBC: 12.4 10*3/uL — ABNORMAL HIGH (ref 4.0–10.5)
WBC: 14.2 10*3/uL — ABNORMAL HIGH (ref 4.0–10.5)
WBC: 9 10*3/uL (ref 4.0–10.5)

## 2010-07-22 LAB — DIFFERENTIAL
Basophils Absolute: 0 10*3/uL (ref 0.0–0.1)
Eosinophils Relative: 2 % (ref 0–5)
Lymphocytes Relative: 4 % — ABNORMAL LOW (ref 12–46)
Monocytes Absolute: 0.6 10*3/uL (ref 0.1–1.0)
Neutro Abs: 12.4 10*3/uL — ABNORMAL HIGH (ref 1.7–7.7)
Neutro Abs: 5.8 10*3/uL (ref 1.7–7.7)

## 2010-07-22 LAB — GLUCOSE, CAPILLARY
Glucose-Capillary: 300 mg/dL — ABNORMAL HIGH (ref 70–99)
Glucose-Capillary: 50 mg/dL — ABNORMAL LOW (ref 70–99)
Glucose-Capillary: 140 mg/dL — ABNORMAL HIGH (ref 70–99)
Glucose-Capillary: 167 mg/dL — ABNORMAL HIGH (ref 70–99)
Glucose-Capillary: 169 mg/dL — ABNORMAL HIGH (ref 70–99)
Glucose-Capillary: 177 mg/dL — ABNORMAL HIGH (ref 70–99)
Glucose-Capillary: 207 mg/dL — ABNORMAL HIGH (ref 70–99)
Glucose-Capillary: 213 mg/dL — ABNORMAL HIGH (ref 70–99)
Glucose-Capillary: 217 mg/dL — ABNORMAL HIGH (ref 70–99)
Glucose-Capillary: 222 mg/dL — ABNORMAL HIGH (ref 70–99)
Glucose-Capillary: 266 mg/dL — ABNORMAL HIGH (ref 70–99)
Glucose-Capillary: 286 mg/dL — ABNORMAL HIGH (ref 70–99)
Glucose-Capillary: 371 mg/dL — ABNORMAL HIGH (ref 70–99)
Glucose-Capillary: 389 mg/dL — ABNORMAL HIGH (ref 70–99)
Glucose-Capillary: 589 mg/dL (ref 70–99)

## 2010-07-22 LAB — BLOOD GAS, ARTERIAL
Acid-base deficit: 5.9 mmol/L — ABNORMAL HIGH (ref 0.0–2.0)
FIO2: 0.21 %
FIO2: 1 %
O2 Saturation: 98.8 %
O2 Saturation: 99.3 %
TCO2: 20.6 mmol/L (ref 0–100)
pH, Arterial: 7.299 — ABNORMAL LOW (ref 7.350–7.450)
pO2, Arterial: 172 mmHg — ABNORMAL HIGH (ref 80.0–100.0)
pO2, Arterial: 272 mmHg — ABNORMAL HIGH (ref 80.0–100.0)

## 2010-07-22 LAB — POCT I-STAT, CHEM 8
BUN: 17 mg/dL (ref 6–23)
Calcium, Ion: 0.98 mmol/L — ABNORMAL LOW (ref 1.12–1.32)
Chloride: 106 meq/L (ref 96–112)
Creatinine, Ser: 0.9 mg/dL (ref 0.4–1.5)
Glucose, Bld: 598 mg/dL (ref 70–99)
HCT: 45 % (ref 39.0–52.0)
Hemoglobin: 15.3 g/dL (ref 13.0–17.0)
Potassium: 3.6 meq/L (ref 3.5–5.1)
Sodium: 137 meq/L (ref 135–145)
TCO2: 16 mmol/L (ref 0–100)

## 2010-07-22 LAB — CARDIAC PANEL(CRET KIN+CKTOT+MB+TROPI)
CK, MB: 1119.7 ng/mL (ref 0.3–4.0)
CK, MB: 752.9 ng/mL (ref 0.3–4.0)
Relative Index: 1.8 (ref 0.0–2.5)
Relative Index: 10.3 — ABNORMAL HIGH (ref 0.0–2.5)
Total CK: 9468 U/L — ABNORMAL HIGH (ref 7–232)
Troponin I: 60.05 ng/mL (ref 0.00–0.06)

## 2010-07-22 LAB — URINALYSIS, ROUTINE W REFLEX MICROSCOPIC
Glucose, UA: >1000 mg/dL — AB
Glucose, UA: >1000 mg/dL — AB
Leukocytes, UA: NEGATIVE
Specific Gravity, Urine: 1.027 (ref 1.005–1.030)
Specific Gravity, Urine: >1.046 — ABNORMAL HIGH (ref 1.005–1.030)
Urobilinogen, UA: 1 mg/dL (ref 0.0–1.0)

## 2010-07-22 LAB — HEPATIC FUNCTION PANEL
ALT: 169 U/L — ABNORMAL HIGH (ref 0–53)
AST: 321 U/L — ABNORMAL HIGH (ref 0–37)
Total Protein: 4.6 g/dL — ABNORMAL LOW (ref 6.0–8.3)

## 2010-07-22 LAB — APTT
aPTT: 30 seconds (ref 24–37)
aPTT: 93 seconds — ABNORMAL HIGH (ref 24–37)

## 2010-07-22 LAB — PHOSPHORUS: Phosphorus: 2.3 mg/dL (ref 2.3–4.6)

## 2010-07-22 LAB — HEMOGLOBIN A1C
Hgb A1c MFr Bld: 9.3 % — ABNORMAL HIGH (ref <5.7)
Mean Plasma Glucose: 220 mg/dL — ABNORMAL HIGH (ref <117)

## 2010-07-22 LAB — PROTIME-INR: INR: 1.59 — ABNORMAL HIGH (ref 0.00–1.49)

## 2010-07-22 LAB — MRSA PCR SCREENING: MRSA by PCR: NEGATIVE

## 2010-07-22 LAB — PROCALCITONIN: Procalcitonin: <0.5 ng/mL

## 2010-07-22 LAB — CROSSMATCH
ABO/RH(D): AB POS
Antibody Screen: NEGATIVE

## 2010-07-22 LAB — LIPID PANEL: VLDL: 8 mg/dL (ref 0–40)

## 2010-07-22 LAB — BRAIN NATRIURETIC PEPTIDE
Pro B Natriuretic peptide (BNP): 158 pg/mL — ABNORMAL HIGH (ref 0.0–100.0)
Pro B Natriuretic peptide (BNP): 268 pg/mL — ABNORMAL HIGH (ref 0.0–100.0)

## 2010-07-22 LAB — ABO/RH: ABO/RH(D): AB POS

## 2010-07-22 LAB — MAGNESIUM: Magnesium: 2.2 mg/dL (ref 1.5–2.5)

## 2010-07-22 LAB — URINE CULTURE: Culture: NO GROWTH

## 2010-07-22 LAB — POCT CARDIAC MARKERS: Troponin i, poc: 1.71 ng/mL (ref 0.00–0.09)

## 2010-08-05 ENCOUNTER — Telehealth: Payer: Self-pay | Admitting: Cardiology

## 2010-08-05 NOTE — Telephone Encounter (Signed)
Pt needs samples of effient.

## 2010-08-05 NOTE — Telephone Encounter (Signed)
Pt's wife walked into office to pick up Effient samples. She states she called earlier and said her husband needed effient.  She did not receive call back that samples were available.  I explained to pt that when requesting samples she should wait for call back to confirm that samples were available.  Pt's wife given 7 Effient tablets.  I offered to give her pt assistance form but she stated they would not qualify. She is also asking how long pt will be on Effient.  I told her I would forward note to Dr. Shirlee Latch to address this question.

## 2010-08-06 NOTE — Telephone Encounter (Signed)
Continue Effient until 6/12.

## 2010-08-06 NOTE — Telephone Encounter (Signed)
NA

## 2010-08-08 NOTE — Telephone Encounter (Signed)
Wife aware of Dr Alford Highland recommendation to continue Effient until 6/12.

## 2010-08-19 ENCOUNTER — Other Ambulatory Visit: Payer: 59 | Admitting: *Deleted

## 2010-08-31 ENCOUNTER — Encounter: Payer: Self-pay | Admitting: Cardiology

## 2010-09-06 ENCOUNTER — Ambulatory Visit: Payer: 59 | Admitting: Cardiology

## 2010-09-06 ENCOUNTER — Ambulatory Visit (INDEPENDENT_AMBULATORY_CARE_PROVIDER_SITE_OTHER): Payer: 59 | Admitting: Cardiology

## 2010-09-06 ENCOUNTER — Encounter: Payer: Self-pay | Admitting: Cardiology

## 2010-09-06 VITALS — BP 140/78 | HR 60 | Ht 72.0 in | Wt 201.0 lb

## 2010-09-06 DIAGNOSIS — I5022 Chronic systolic (congestive) heart failure: Secondary | ICD-10-CM

## 2010-09-06 DIAGNOSIS — E785 Hyperlipidemia, unspecified: Secondary | ICD-10-CM

## 2010-09-06 DIAGNOSIS — I2510 Atherosclerotic heart disease of native coronary artery without angina pectoris: Secondary | ICD-10-CM

## 2010-09-06 MED ORDER — CYCLOBENZAPRINE HCL 5 MG PO TABS: 5.0000 mg | ORAL_TABLET | Freq: Three times a day (TID) | ORAL | Status: DC | PRN

## 2010-09-06 NOTE — Patient Instructions (Addendum)
You can stop Effient at the end of May.   Decrease Aspirin to 162mg  daily. This will be two 81mg  aspirin daily.  Schedule an appointment for fasting lab--lipid profile/liver profile/BMP 414.01  Use cyclobenzaprine 5mg  three times a day as needed for muscle pain.  Schedule an appointment to see Dr Shirlee Latch in 4 months. (September 2012)

## 2010-09-08 NOTE — Assessment & Plan Note (Addendum)
No significant exertional dyspnea.  EF 42% by MRI, does not qualify for ICD.  Continue current doses of enalapril, Coreg, and spironolactone.  Will get BMET to followup with K level which had been high normal in the past.   I will let him try cyclobenzaprine for his low back pain.  It would be a good idea to avoid chronic NSAIDS.

## 2010-09-08 NOTE — Progress Notes (Signed)
50 yo with history of diabetes was admitted to [facility] in 5/11 with inferior STEMI. Patient had collapsed at home and got CPR from his son and EMS for cardiac arrest.  He had a perfusing rhythm in the ER.  Left heart cath showed a totally occluded RCA which was treated with 2 PROMUS drug-eluting stents.  Followup echo showed EF 35-40% with inferior and inferoseptal akinesis.  Cardiac MRI was done in 9/11 to quantify EF for possible ICD.  This showed EF 42% with inferior scar.   Patient has been doing well in general.  He is back at work full time.  He is very active and able to do all physical work without any problem.  No exertional dyspnea or chest pain.  Blood glucose is coming under control.  He feels like his energy level is getting better.  Main problem has been a toothache.  He needs some dental work.  He also has bothersome low back pain.   ECG: NSR, 1st degree AV block, old inferior MI  Labs (5/11): K 4.5, creatinine 1.23, BNP 158, LDL 119, HDL 41, BNP 402 Labs (11/11): K 5.2, creatinine 0.9, BNP 216  Allergies (verified):  1)  ! Codeine  Past Medical History: 1. Diabetes mellitus: on insulin.  Developed diabetes after a coxsackievirus infection.  2. Ischemic cardiomyopathy: Echo (5/11) EF 30-35% with inferior and inferoseptal akinesis, posterior hypokinesis, mild MR, mild to moderately decreased RV systolic function, dilated IVC.   Echo (6/11): EF 35-40%, inferior and inferoseptal akinesis, no significant valvular abnormalities.  Cardiac MRI (9/11): EF 42%, mildly dilated LV, mid to apical inferior and mid posterior 76-99% thickness subendocardial delayed enhancement.  3. Coronary artery disease: Inferior STEMI 5/11 complicated by cardiac arrest requiring CPR.  LHC showed total occlusion proximal RCA, 60-70% proximal LAD at D1, and 60% ostial D1.  Patient had 2 overlapping PROMUS stents to the RCA.  4. Probably foley catheter trauma with hematuria (5/11) 5. Tobacco abuse 6.   Hyperlipidemia 7. Carotid doppler (6/11): 0-39% bilateral ICA stenosis.   Family History: Notable for a father with a myocardial infarction in his 44s.  Family History of Arthritis Heart Disease (Parents) Family History Diabetes 1st degree relative (grandparent) Family History High cholesterol Family History Hypertension Family History Lung cancer Family History of Stroke   Social History: The patient lives in [place] with his wife and son.  He has a 1-pack-per-day history of smoking for approximately 30 years, quit smoking in 2011.  He denies any alcohol or drug use. Works in [employer] and does part time work as a Museum/gallery exhibitions officer        All systems reviewed and negative except as per HPI.   Current Outpatient Prescriptions  Medication Sig Dispense Refill  . carvedilol (COREG) 12.5 MG tablet Take 12.5 mg by mouth 2 (two) times daily.        . Cinnamon Oil OIL by Does not apply route. 1000 two times a day       . enalapril (VASOTEC) 5 MG tablet Take 1.5 tablets two times a day       . fish oil-omega-3 fatty acids 1000 MG capsule Take 1 capsule by mouth 2 (two) times daily.        . Garlic-Parsley 578-46 MG CAPS Take 1 capsule by mouth daily.        Marland Kitchen glucose blood test strip 4X a day, and lancets 250.030  Variable glucoses       .  insulin aspart (NOVOLOG FLEXPEN) 100 UNIT/ML injection as needed.        . insulin glargine (LANTUS) 100 UNIT/ML injection 55 units each arm, and 12 in the evening, and syringes two times a day       . nitroGLYCERIN (NITROSTAT) 0.4 MG SL tablet 1 SL as needed for chest pain       . simvastatin (ZOCOR) 40 MG tablet Take 40 mg by mouth daily.        Marland Kitchen spironolactone (ALDACTONE) 25 MG tablet Take 25 mg by mouth daily.        . traMADol (ULTRAM) 50 MG tablet Take 1 or 2 tablets two times a day as needed for pain       . vitamin E 400 UNIT capsule 2 capsules daily       . aspirin EC 81 MG EC tablet Take two tablets daily      .  cyclobenzaprine (FLEXERIL) 5 MG tablet Take 1 tablet (5 mg total) by mouth every 8 (eight) hours as needed for muscle spasms.  30 tablet  0    BP 140/78  Pulse 60  Ht 6' (1.829 m)  Wt 201 lb (91.173 kg)  BMI 27.26 kg/m2 General: NAD Neck: No JVD, no thyromegaly or thyroid nodule.  Lungs: Clear to auscultation bilaterally with normal respiratory effort. CV: Nondisplaced PMI.  Heart regular S1/S2, no S3/S4, no murmur.  No peripheral edema.  No carotid bruit.  Normal pedal pulses.  Abdomen: Soft, nontender, no hepatosplenomegaly, no distention.  Neurologic: Alert and oriented x 3.  Psych: Normal affect. Extremities: No clubbing or cyanosis.

## 2010-09-08 NOTE — Assessment & Plan Note (Signed)
Check lipids/LFTs.  Goal LDL < 70.  

## 2010-09-08 NOTE — Assessment & Plan Note (Signed)
Stable s/p inferior STEMI with RCA PCI.  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  I will let him stop it after the end of this month (will complete a year of treatment.  He will continue ASA at 162 mg daily. Continue Crestor, Coreg, ACEI.  Patient has a residual moderate LAD stenosis.  He is doing overall well and is back at work with no problems.

## 2010-09-12 ENCOUNTER — Other Ambulatory Visit (INDEPENDENT_AMBULATORY_CARE_PROVIDER_SITE_OTHER): Payer: 59 | Admitting: *Deleted

## 2010-09-12 DIAGNOSIS — I2510 Atherosclerotic heart disease of native coronary artery without angina pectoris: Secondary | ICD-10-CM

## 2010-09-12 DIAGNOSIS — E785 Hyperlipidemia, unspecified: Secondary | ICD-10-CM

## 2010-09-12 LAB — HEPATIC FUNCTION PANEL
AST: 25 U/L (ref 0–37)
Alkaline Phosphatase: 68 U/L (ref 39–117)
Bilirubin, Direct: 0.1 mg/dL (ref 0.0–0.3)
Total Bilirubin: 0.9 mg/dL (ref 0.3–1.2)

## 2010-09-12 LAB — BASIC METABOLIC PANEL
CO2: 32 mEq/L (ref 19–32)
Calcium: 9.4 mg/dL (ref 8.4–10.5)
Chloride: 109 mEq/L (ref 96–112)
Creatinine, Ser: 0.8 mg/dL (ref 0.4–1.5)
Glucose, Bld: 31 mg/dL — CL (ref 70–99)

## 2010-09-12 LAB — LIPID PANEL
LDL Cholesterol: 77 mg/dL (ref 0–99)
Total CHOL/HDL Ratio: 4

## 2010-09-13 ENCOUNTER — Telehealth: Payer: Self-pay | Admitting: *Deleted

## 2010-09-13 NOTE — Telephone Encounter (Signed)
Received lab results done 09/12/10 at [facility]. Dr Shirlee Latch reviewed. Lipids OK. No new recommendations by Dr Shirlee Latch. Labs forwarded to HIM to be scanned into EPIC.

## 2010-09-20 NOTE — H&P (Signed)
[place]. [facility]  Patient:    Erik Bird, Erik Bird                            MRN: [id number] Adm. Date:  09/16/99 Disc. Date: 09/16/99 Attending:  Mena Pauls, M.D. Dictator:   Cheree Ditto, M.D. CC:         [facility]                         History and Physical  CHIEF COMPLAINT:  Vomiting and hyperglycemia.  HISTORY OF PRESENT ILLNESS:  The patient is a 51 year old male diagnosed around four year ago with diabetes mellitus type 1 who presents with two to three days of vomiting. No fevers. He has not been able to keep down any fluids. His highest blood glucose at home was 423. His blood glucose had been slightly higher than usual (190s) the few days prior to onset of vomiting. Per his wife, his last hemoglobin A1C was in the 11 range. No diarrhea, occasional cough. He feels mildly short of breath. His ribs are sore, especially on the right from vomiting. No abdominal pain. His son had strep pharyngitis and vomiting approximately one week ago.  PAST MEDICAL HISTORY/PAST SURGICAL HISTORY: 1. Diabetes mellitus type 1. 2. 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 3. ? ______  disease, status post surgery on right hand in the late 80s. 4. Right thumb saw accident around three years ago.  ALLERGIES:  CODEINE (nausea).  CURRENT MEDICATIONS: 1. Ultra Lente 42 units q.h.s. 2. Humalog 6 to 8 units t.i.d. with meals. 3. NPH 17 units q.a.m. 4. Vitamin E. 5. Aspirin q.d.  SOCIAL HISTORY:  Works in Production designer, theatre/television/film for [employer]. Smokes a pack a day. Smoker since age 50. No alcohol or illicit drugs. He is married.  FAMILY HISTORY:  His father has his first MI at age 51. Family history is positive also for hypertension and diabetes mellitus type 2  REVIEW OF SYSTEMS:  No dysuria. No hematuria. No blood in his stools and as above. Otherwise unremarkable.  PHYSICAL EXAMINATION:  VITAL SIGNS:  Blood pressure 95 to 117/56 to 60, O2  saturation 97% on room air, heart rate 102 to 113, respiratory rate 28.  GENERAL:  He is drowsy but answers questions appropriately.  HEENT:  Dry mucous membranes. Tympanic membranes are normal. Oropharynx is clear. Pupils are equal, round, and reactive to light and accommodation. Extraocular movements intact.  NECK:  Supple without lymphadenopathy.  CHEST:  Clear to auscultation bilaterally.  CARDIOVASCULAR:  Tachycardic with a regular rhythm. No murmur.  ABDOMEN:  Soft, nontender. Positive bowel sounds. No organomegaly. No mass.  EXTREMITIES:  No clubbing, cyanosis, or edema. The right portion of his thumb is missing.  NEUROLOGICAL:  Alert and oriented x 3 but drowsy. Cranial nerves 2 through 12 are intact.  LABORATORY DATA:  Sep 16, 1999, at 0954:  Sodium 126, potassium 6.1, chloride 99, CO2 5, BUN 46, glucose 656. A pH 7.078, bicarb 5, pCO2 16. White count 23.7, hemoglobin 16.9, hematocrit 51.5, platelets 439. Urinalysis is pending. Sep 16, 1999, at 1038:  Sodium 130, potassium 4.7, chloride 103, CO2 4, BUN 48, glucose 526. A pH 7.040, pCO2 14, bicarb 4.  Chest x-ray no active disease. There are no rib fractures.  ASSESSMENT AND PLAN:  The patient is a 50 year old male with type 1 diabetes who presents with diabetic ketoacidosis  after having several days of vomiting at home. Trigger is likely a viral gastroenteritis. We will admit, start on insulin drip IV, continue aggressive fluid replacement. Replace potassium as needed, monitor frequent BMETs and q.1h. CBGs. We will monitor in a step-down unit initially. Phenergan for nausea. Start glucose in IV when CBG is less than 250. We will begin glucose with D10 considering his episodes of seizures with hypoglycemia. In addition, we will rule out sepsis with blood cultures and a urine culture. Finally, we will check an EKG and serial troponins given his family history of early heart disease. DD:  09/16/99 TD:  09/16/99 Job:  [id number]

## 2010-09-20 NOTE — Discharge Summary (Signed)
[place]. [facility]  Patient:    Erik Bird, Erik Bird                          MRN: [id number] Adm. Date:  [id number] Disc. Date: [id number] Attending:  McDiarmid, Leighton Roach. Dictator:   Raynelle Jan CC:         Dr. Collins [place], [facility]   Discharge Summary  DISCHARGE DIAGNOSIS:  Diabetes mellitus type 1 with diabetic ketoacidosis.  DISCHARGE MEDICATIONS: 1. Enteric coated aspirin 325 mg p.o. q.d. 2. Humalog 6 to 8 units t.i.d. with meals per carb-counting. 3. __________ 17 units q.a.m. 4. NPH 42 units at bedtime. 5. Tylenol p.r.n. for pain.  DISCHARGE DIET:  The patient should follow the diabetic diet as instructed by his dietician.  DISCHARGE FOLLOW-UP:  He is to follow up with the diabetes instructor at his physicians office and with Dr. Faustino Congress on Sep 20, 1999, at 9:15 a.m.  ADMISSION HISTORY:  This is a 50 year old male diagnosed approximately four years ago with type 1 diabetes who presents with a history of two to three days of vomiting.  His highest blood sugar had been 423 at home and states that his fingersticks have been higher than usual the few days prior to the onset of vomiting.  He has had no diarrhea and no abdominal pain.  He notes that his son had a similar illness approximately one week ago with vomiting as well.  PAST MEDICAL HISTORY:  This was significant for only the diabetes, history of a seizure secondary to hypoglycemia.  PAST SURGICAL HISTORY:  He had surgery on his right hand and right thumb secondary to accident.  ADMISSION PHYSICAL EXAMINATION:  His vital signs were stable however, he was found to be somewhat drowsy with dry mucous membranes, tachycardic, with a regular rhythm.  His lungs were clear to auscultation.  His abdomen was soft and nontender with positive bowel sounds.  There were no masses or organomegaly.  LABS ON ADMISSION:  His serum glucose was 656, sodium 126, potassium  6.1, chloride 99, bicarb 5, BUN 46.  His pH was 7.078, pCO2 16.  His white count was 23.7 with an hemoglobin of 16.9 and hematocrit 51.5, platelets 434.  HOSPITAL COURSE:  He was admitted for DKA triggered by what was thought to be most likely a viral gastroenteritis. He was admitted and started on an insulin drip with frequent CBGs and basic metabolic panels to follow his potassium and blood glucose.  On the morning following admission, his acidosis had resolved with a bicarb of 220 and his CBGs slowly became under better control ranging anywhere from 89 to 380.  Once his acidosis cleared, he was weaned off of the insulin drip and his IV fluids were slowly decreased as his p.o. status improved.  A diabetes education coordination referral as well as nutrition management was made during his hospitalization.  However, the patient was somewhat unreceptive to further education.  On Sep 18, 1999, the patient was distraught as his CBGs remained high in the hospital and refused our attempts to keep him in the hospital another day to further titrate his insulin regimen.  DISPOSITION:  He was placed back on his home regimen with strict instructions to return to the hospital and call his physician for any nausea, vomiting or blood sugars greater than 400.  He will have close follow-up with his primary M.D. as our best attempts to keep him  in the hospital to await further control was unsuccessful. DD:  12/25/99 TD:  12/26/99 Job: [id number]

## 2010-10-01 ENCOUNTER — Ambulatory Visit: Payer: 59 | Admitting: Endocrinology

## 2010-10-04 ENCOUNTER — Other Ambulatory Visit (INDEPENDENT_AMBULATORY_CARE_PROVIDER_SITE_OTHER): Payer: 59

## 2010-10-04 ENCOUNTER — Encounter: Payer: Self-pay | Admitting: Endocrinology

## 2010-10-04 ENCOUNTER — Ambulatory Visit (INDEPENDENT_AMBULATORY_CARE_PROVIDER_SITE_OTHER): Payer: 59 | Admitting: Endocrinology

## 2010-10-04 VITALS — BP 150/88 | HR 70 | Temp 98.4°F | Ht 71.0 in | Wt 199.4 lb

## 2010-10-04 DIAGNOSIS — E119 Type 2 diabetes mellitus without complications: Secondary | ICD-10-CM

## 2010-10-04 LAB — HEMOGLOBIN A1C: Hgb A1c MFr Bld: 10 % — ABNORMAL HIGH (ref 4.6–6.5)

## 2010-10-04 NOTE — Progress Notes (Signed)
Subjective:    Patient ID: Erik Bird, male    DOB: 25-Nov-1960, 49 y.o.   MRN: [id number]  HPI pt states he feels well in general.  he brings a record of his cbg's which i have reviewed today. It is in the 90's approx 2/week (lunch and supper).  It is consistently in the 200's at hs, and in am.   Past Medical History  Diagnosis Date  . DM (diabetes mellitus)     on insulin.  Developed diabetes after a coxsackievirus infection.  . Ischemic cardiomyopathy     Echo (5/11) EF 30-35% with inferior and inferoseptal akinesis, posterior  hypokinesis, mild MR, mild to moderately decreased RV systolic function, dilated IVC.   Echo (6/11): EF  35-40%, inferior and inferoseptal akinesis, no significant valvular abnormalities.  Cardiac MRI (9/11): EF  42%, mildly dilated LV, mid to apical inferior and mid posterior 76-99% thickness subendocardial delayed  enhance  . CAD (coronary artery disease)     Inferior STEMI 5/11 complicated by cardiac arrest requiring CPR.  LHC showed  total occlusion proximal RCA, 60-70% proximal LAD at D1, and 60% ostial D1.  Patient had 2 overlapping  PROMUS stents to the RCA.  Marland Kitchen Hematuria 09/2009     Probably foley catheter trauma with hematuria   . Tobacco abuse   . Hyperlipidemia   . Carotid stenosis 10/2009    Carotid doppler (6/11): 0-39% bilateral ICA stenosis.    No past surgical history on file.  History   Social History  . Marital Status: Single    Spouse Name: N/A    Number of Children: 1  . Years of Education: N/A   Occupational History  . maintenance    Social History Main Topics  . Smoking status: Former Smoker -- 1.0 packs/day for 30 years    Types: Cigarettes    Quit date: 10/03/2009  . Smokeless tobacco: Not on file   Comment: quit smoking in 2011  . Alcohol Use: No  . Drug Use: No  . Sexually Active: Not on file   Other Topics Concern  . Not on file   Social History Narrative  . No narrative on file    Current Outpatient  Prescriptions on File Prior to Visit  Medication Sig Dispense Refill  . aspirin EC 81 MG EC tablet Take two tablets daily      . carvedilol (COREG) 12.5 MG tablet Take 12.5 mg by mouth 2 (two) times daily.        . Cinnamon Oil OIL by Does not apply route. 1000 two times a day       . cyclobenzaprine (FLEXERIL) 5 MG tablet Take 1 tablet (5 mg total) by mouth every 8 (eight) hours as needed for muscle spasms.  30 tablet  0  . enalapril (VASOTEC) 5 MG tablet Take 1.5 tablets two times a day       . fish oil-omega-3 fatty acids 1000 MG capsule Take 1 capsule by mouth 2 (two) times daily.        . Garlic-Parsley 409-81 MG CAPS Take 1 capsule by mouth daily.        Marland Kitchen glucose blood test strip 4X a day, and lancets 250.030  Variable glucoses       . insulin aspart (NOVOLOG FLEXPEN) 100 UNIT/ML injection as needed.        . insulin glargine (LANTUS) 100 UNIT/ML injection 55 units each arm, and 12 in the evening      . nitroGLYCERIN (  NITROSTAT) 0.4 MG SL tablet 1 SL as needed for chest pain       . prasugrel (EFFIENT) 10 MG TABS Take 10 mg by mouth daily.        . simvastatin (ZOCOR) 40 MG tablet Take 40 mg by mouth daily.        Marland Kitchen spironolactone (ALDACTONE) 25 MG tablet Take 25 mg by mouth daily.        . vitamin E 400 UNIT capsule 2 capsules daily       . DISCONTD: traMADol (ULTRAM) 50 MG tablet Take 1 or 2 tablets two times a day as needed for pain         Allergies  Allergen Reactions  . Codeine     Family History  Problem Relation Age of Onset  . Heart attack Father 27  . Arthritis    . Heart disease      both parents  . Diabetes      grandparent  . Hyperlipidemia    . Hypertension    . Lung cancer    . Stroke      BP 150/88  Pulse 70  Temp(Src) 98.4 F (36.9 C) (Oral)  Ht 5\' 11"  (1.803 m)  Wt 199 lb 6.4 oz (90.447 kg)  BMI 27.81 kg/m2  SpO2 94%    Review of Systems denies hypoglycemia    Objective:   Physical Exam GENERAL: no distress SKIN: Insulin injection sites  at the anterior abdomen are normal       Assessment & Plan:  Type 1 dm.  He appears to need a small amt of scheduled insulin with supper.

## 2010-10-04 NOTE — Patient Instructions (Addendum)
check your blood sugar 4 times a day--before the 3 meals, and at bedtime.  also check if you have symptoms of your blood sugar being too high or too low.  please keep a record of the readings and bring it to your next appointment here.  please call us sooner if you are having low blood sugar episodes. blood tests are being ordered for you today.  please call [phone] to hear your test results.  You will be prompted to enter the 9-digit "MRN" number that appears at the top left of this page, followed by #.  Then you will hear the message. pending the test results, please increase lantus to 55 units each am, and 10 units in the evening.   change novolog to only: 4x a day (before meals, and at bedtime): < 300:  none. 300's:  2 units. over 400:  4 units. Also, take novolog 3 units with supper (in addition to the as-needed). Please schedule a regular physical appointment in 3 months.

## 2010-10-16 ENCOUNTER — Telehealth: Payer: Self-pay | Admitting: *Deleted

## 2010-10-16 NOTE — Telephone Encounter (Signed)
Left message for pt to callback office.  

## 2010-10-16 NOTE — Telephone Encounter (Signed)
Pt's spouse called back stating that pt's CBG have slightly improved since adjustment of insulin. Pt will callback with record of CBG's when SAE returns on Monday to see if any more adjustments need to be made.

## 2010-10-16 NOTE — Telephone Encounter (Signed)
Message copied by Carin Primrose on Wed Oct 16, 2010  2:43 PM ------      Message from: Ian Malkin      Created: Tue Oct 15, 2010  4:02 PM                   ----- Message -----         From: Minus Breeding, MD         Sent: 10/04/2010   8:03 PM           To: Jocelyn Lamer            i left message on phone tree      Call if the adjustments we made today are not enough

## 2010-11-14 ENCOUNTER — Telehealth: Payer: Self-pay

## 2010-11-14 NOTE — Telephone Encounter (Signed)
Liborio Nixon, pt's spouse called requesting Lantus be changed to Levermir due to cost. Spouse advised that MD out of office until 07/16 and will expect a call back when he returns.

## 2010-11-15 MED ORDER — INSULIN DETEMIR 100 UNIT/ML ~~LOC~~ SOLN: SUBCUTANEOUS | Status: DC

## 2010-11-15 NOTE — Telephone Encounter (Signed)
i changed rx Your cbg's could change on this.  Call if you have lows, or if it stays over 200

## 2010-11-20 NOTE — Telephone Encounter (Signed)
Pt's spouse informed of MD's advisement.  

## 2011-01-13 ENCOUNTER — Ambulatory Visit: Payer: 59 | Admitting: Cardiology

## 2011-01-17 ENCOUNTER — Encounter: Payer: 59 | Admitting: Endocrinology

## 2011-01-27 ENCOUNTER — Ambulatory Visit (INDEPENDENT_AMBULATORY_CARE_PROVIDER_SITE_OTHER): Payer: 59 | Admitting: Endocrinology

## 2011-01-27 ENCOUNTER — Encounter: Payer: Self-pay | Admitting: Endocrinology

## 2011-01-27 DIAGNOSIS — Z87891 Personal history of nicotine dependence: Secondary | ICD-10-CM

## 2011-01-27 DIAGNOSIS — Z23 Encounter for immunization: Secondary | ICD-10-CM

## 2011-01-27 DIAGNOSIS — M25559 Pain in unspecified hip: Secondary | ICD-10-CM

## 2011-01-27 DIAGNOSIS — E119 Type 2 diabetes mellitus without complications: Secondary | ICD-10-CM

## 2011-01-27 DIAGNOSIS — Z125 Encounter for screening for malignant neoplasm of prostate: Secondary | ICD-10-CM | POA: Insufficient documentation

## 2011-01-27 NOTE — Patient Instructions (Addendum)
check your blood sugar 4 times a day--before the 3 meals, and at bedtime.  also check if you have symptoms of your blood sugar being too high or too low.  please keep a record of the readings and bring it to your next appointment here.  please call us sooner if you are having low blood sugar episodes. blood tests are being ordered for you today.  please call [phone] to hear your test results.  You will be prompted to enter the 9-digit "MRN" number that appears at the top left of this page, followed by #.  Then you will hear the message. pending the test results, please increase lantus to 70 units each morning. change novolog to only:  4x a day (before meals, and at bedtime):  < 300:  none. 300's:  2 units. over 400:  4 units. Also, take novolog 8 units with supper (in addition to the as-needed). Please schedule a regular physical appointment in 4 months.  good diet and exercise habits significanly improve the control of your diabetes.  please let me know if you wish to be referred to a dietician.  high blood sugar is very risky to your health.  you should see an eye doctor every year. controlling your blood pressure and cholesterol drastically reduces the damage diabetes does to your body.  this also applies to quitting smoking.  please discuss these with your doctor.  you should take an aspirin every day, unless you have been advised by a doctor not to. please consider these measures for your health:  minimize alcohol.  do not use tobacco products.  keep firearms safely stored.  always use seat belts.  have working smoke alarms in your home.  see an eye doctor and dentist regularly.  never drive under the influence of alcohol or drugs (including prescription drugs).  those with fair skin should take precautions against the sun. Refer to an orthopedic specialist.  you will receive a phone call, about a day and time for an appointment. You should avoid injecting into the swollen areas of your stomach.    (update: i left message on phone-tree:  rx as we discussed)

## 2011-01-27 NOTE — Progress Notes (Signed)
Subjective:    Patient ID: Erik Bird, male    DOB: 1961/01/18, 50 y.o.   MRN: [id number]  HPI here for regular wellness examination.  He's feeling pretty well in general, and says chronic med probs are stable, except as noted below Past Medical History  Diagnosis Date  . DM (diabetes mellitus)     on insulin.  Developed diabetes after a coxsackievirus infection.  . Ischemic cardiomyopathy     Echo (5/11) EF 30-35% with inferior and inferoseptal akinesis, posterior  hypokinesis, mild MR, mild to moderately decreased RV systolic function, dilated IVC.   Echo (6/11): EF  35-40%, inferior and inferoseptal akinesis, no significant valvular abnormalities.  Cardiac MRI (9/11): EF  42%, mildly dilated LV, mid to apical inferior and mid posterior 76-99% thickness subendocardial delayed  enhance  . CAD (coronary artery disease)     Inferior STEMI 5/11 complicated by cardiac arrest requiring CPR.  LHC showed  total occlusion proximal RCA, 60-70% proximal LAD at D1, and 60% ostial D1.  Patient had 2 overlapping  PROMUS stents to the RCA.  Marland Kitchen Hematuria 09/2009     Probably foley catheter trauma with hematuria   . Tobacco abuse   . Hyperlipidemia   . Carotid stenosis 10/2009    Carotid doppler (6/11): 0-39% bilateral ICA stenosis.    No past surgical history on file.  History   Social History  . Marital Status: Single    Spouse Name: N/A    Number of Children: 1  . Years of Education: N/A   Occupational History  . maintenance    Social History Main Topics  . Smoking status: Former Smoker -- 1.0 packs/day for 30 years    Types: Cigarettes    Quit date: 10/03/2009  . Smokeless tobacco: Not on file   Comment: quit smoking in 2011  . Alcohol Use: No  . Drug Use: No  . Sexually Active: Not on file   Other Topics Concern  . Not on file   Social History Narrative  . No narrative on file    Current Outpatient Prescriptions on File Prior to Visit  Medication Sig Dispense Refill  .  aspirin EC 81 MG EC tablet Take two tablets daily      . carvedilol (COREG) 12.5 MG tablet Take 12.5 mg by mouth 2 (two) times daily.        . Cinnamon Oil OIL by Does not apply route. 1000 two times a day       . enalapril (VASOTEC) 5 MG tablet Take 1.5 tablets two times a day       . fish oil-omega-3 fatty acids 1000 MG capsule Take 1 capsule by mouth 2 (two) times daily.        . Garlic-Parsley 409-81 MG CAPS Take 1 capsule by mouth daily.        Marland Kitchen glucose blood test strip 4X a day, and lancets 250.030  Variable glucoses       . insulin aspart (NOVOLOG FLEXPEN) 100 UNIT/ML injection as needed.        . nitroGLYCERIN (NITROSTAT) 0.4 MG SL tablet 1 SL as needed for chest pain       . simvastatin (ZOCOR) 40 MG tablet Take 40 mg by mouth daily.        Marland Kitchen spironolactone (ALDACTONE) 25 MG tablet Take 25 mg by mouth daily.        . vitamin E 400 UNIT capsule 2 capsules daily  Allergies  Allergen Reactions  . Codeine     Family History  Problem Relation Age of Onset  . Heart attack Father 28  . Arthritis    . Heart disease      both parents  . Diabetes      grandparent  . Hyperlipidemia    . Hypertension    . Lung cancer    . Stroke      BP 128/60  Pulse 60  Temp(Src) 98.5 F (36.9 C) (Oral)  Ht 5\' 11"  (1.803 m)  Wt 197 lb 1.9 oz (89.413 kg)  BMI 27.49 kg/m2  SpO2 97%  Review of Systems  Constitutional: Negative for fever.  HENT: Negative for hearing loss.   Eyes: Negative for visual disturbance.  Respiratory: Negative for shortness of breath.   Cardiovascular: Negative for chest pain.  Gastrointestinal: Negative for anal bleeding.  Genitourinary: Negative for hematuria.  Musculoskeletal: Negative for back pain.  Neurological: Negative for headaches.  Hematological: Does not bruise/bleed easily.  Psychiatric/Behavioral: Negative for dysphoric mood. The patient is nervous/anxious.        Objective:   Physical Exam VS: see vs page GEN: no distress HEAD:  head: no deformity eyes: no periorbital swelling, no proptosis external nose and ears are normal mouth: no lesion seen NECK: supple, thyroid is not enlarged CHEST WALL: no deformity LUNGS: clear to auscultation BREASTS:  No gynecomastia CV: reg rate and rhythm, no murmur ABD: abdomen is soft, nontender.  no hepatosplenomegaly.  not distended.  no hernia MUSCULOSKELETAL: muscle bulk and strength are grossly normal.  no obvious joint swelling.  gait is normal and steady EXTEMITIES: no deformity.  no ulcer on the feet.  feet are of normal color and temp.  no edema PULSES: dorsalis pedis intact bilat.  no carotid bruit NEURO:  cn 2-12 grossly intact.   readily moves all 4's.  sensation is intact to touch on the feet SKIN:  Normal texture and temperature.  No rash or suspicious lesion is visible.   NODES:  None palpable at the neck PSYCH: alert, oriented x3.  Does not appear anxious nor depressed.       Assessment & Plan:  Wellness visit today, with problems stable, except as noted.    SEPARATE EVALUATION FOLLOWS--EACH PROBLEM HERE IS NEW, NOT RESPONDING TO TREATMENT, OR POSES SIGNIFICANT RISK TO THE PATIENT'S HEALTH: HISTORY OF THE PRESENT ILLNESS: he brings a record of his cbg's which i have reviewed today.  cbg's are over 200 at hs, and in am.  He takes lantus 55 units qam, and 11 units qpm.  He has weight gain Pt has 1 year of intermittent moderate bilat hip pain.  No assoc numbness PAST MEDICAL HISTORY reviewed and up to date today REVIEW OF SYSTEMS: denies hypoglycemia and loc PHYSICAL EXAMINATION: VS: see vital signs page SKIN: Insulin injection sites at the anterior abdomen are normal, except for hypertrophy of sq fat Pulses: dorsalis pedis intact bilat.  There is a right carotid bruit.   Feet: no deformity.  no ulcer on the feet.  feet are of normal color and temp.  no edema Neuro: sensation is intact to touch on the feet LAB/XRAY RESULTS: Lab Results  Component Value  Date   HGBA1C 9.4* 01/28/2011  IMPRESSION: Persistent hip pain, uncertain etiology Dm, needs increased rx Hypertrophy of sq fat, due to insulin injections, new PLAN: See instruction page

## 2011-01-28 ENCOUNTER — Other Ambulatory Visit (INDEPENDENT_AMBULATORY_CARE_PROVIDER_SITE_OTHER): Payer: 59

## 2011-01-28 DIAGNOSIS — E119 Type 2 diabetes mellitus without complications: Secondary | ICD-10-CM

## 2011-01-28 DIAGNOSIS — Z125 Encounter for screening for malignant neoplasm of prostate: Secondary | ICD-10-CM

## 2011-01-28 LAB — HEMOGLOBIN A1C: Hgb A1c MFr Bld: 9.4 % — ABNORMAL HIGH (ref 4.6–6.5)

## 2011-01-29 ENCOUNTER — Other Ambulatory Visit: Payer: Self-pay | Admitting: *Deleted

## 2011-01-29 MED ORDER — CYCLOBENZAPRINE HCL 5 MG PO TABS: 5.0000 mg | ORAL_TABLET | Freq: Three times a day (TID) | ORAL | Status: DC | PRN

## 2011-01-29 NOTE — Telephone Encounter (Signed)
Pt's wife informed rx sent to pharmacy.

## 2011-01-29 NOTE — Telephone Encounter (Signed)
R'cd call from pt's wife for refill of Flexeril

## 2011-03-24 ENCOUNTER — Other Ambulatory Visit: Payer: Self-pay

## 2011-03-24 MED ORDER — INSULIN GLARGINE 100 UNIT/ML ~~LOC~~ SOLN: 70.0000 [IU] | Freq: Every day | SUBCUTANEOUS | Status: DC

## 2011-03-24 MED ORDER — INSULIN ASPART 100 UNIT/ML ~~LOC~~ SOLN: SUBCUTANEOUS | Status: DC

## 2011-03-29 ENCOUNTER — Other Ambulatory Visit: Payer: Self-pay | Admitting: Cardiology

## 2011-05-21 ENCOUNTER — Other Ambulatory Visit: Payer: Self-pay | Admitting: *Deleted

## 2011-05-21 MED ORDER — INSULIN GLARGINE 100 UNIT/ML ~~LOC~~ SOLN: 70.0000 [IU] | Freq: Every day | SUBCUTANEOUS | Status: DC

## 2011-05-21 NOTE — Telephone Encounter (Signed)
R'cd fax from [facility] for refill of Lantus Solostar  Last OV-01/2011 Last filled-04/21/2011

## 2011-06-03 ENCOUNTER — Encounter: Payer: Self-pay | Admitting: Endocrinology

## 2011-06-03 ENCOUNTER — Other Ambulatory Visit (INDEPENDENT_AMBULATORY_CARE_PROVIDER_SITE_OTHER): Payer: 59

## 2011-06-03 ENCOUNTER — Ambulatory Visit (INDEPENDENT_AMBULATORY_CARE_PROVIDER_SITE_OTHER): Payer: 59 | Admitting: Endocrinology

## 2011-06-03 VITALS — BP 122/64 | HR 67 | Temp 97.8°F | Ht 69.0 in | Wt 201.2 lb

## 2011-06-03 DIAGNOSIS — E119 Type 2 diabetes mellitus without complications: Secondary | ICD-10-CM

## 2011-06-03 NOTE — Progress Notes (Signed)
Subjective:    Patient ID: Erik Bird, male    DOB: [birth year], 51 y.o.   MRN: [id number]  HPI Pt returns for f/u of type 1 DM (1998).  pt states he feels well in general.  he brings a record of his cbg's which i have reviewed today.  It varies from 93-170.  There is no trend throughout the day. Past Medical History  Diagnosis Date  . DM (diabetes mellitus)     on insulin.  Developed diabetes after a coxsackievirus infection.  . Ischemic cardiomyopathy     Echo (5/11) EF 30-35% with inferior and inferoseptal akinesis, posterior  hypokinesis, mild MR, mild to moderately decreased RV systolic function, dilated IVC.   Echo (6/11): EF  35-40%, inferior and inferoseptal akinesis, no significant valvular abnormalities.  Cardiac MRI (9/11): EF  42%, mildly dilated LV, mid to apical inferior and mid posterior 76-99% thickness subendocardial delayed  enhance  . CAD (coronary artery disease)     Inferior STEMI 5/11 complicated by cardiac arrest requiring CPR.  LHC showed  total occlusion proximal RCA, 60-70% proximal LAD at D1, and 60% ostial D1.  Patient had 2 overlapping  PROMUS stents to the RCA.  Marland Kitchen Hematuria 09/2009     Probably foley catheter trauma with hematuria   . Tobacco abuse   . Hyperlipidemia   . Carotid stenosis 10/2009    Carotid doppler (6/11): 0-39% bilateral ICA stenosis.    No past surgical history on file.  History   Social History  . Marital Status: Single    Spouse Name: N/A    Number of Children: 1  . Years of Education: N/A   Occupational History  . maintenance    Social History Main Topics  . Smoking status: Former Smoker -- 1.0 packs/day for 30 years    Types: Cigarettes    Quit date: 10/03/2009  . Smokeless tobacco: Not on file   Comment: quit smoking in 2011  . Alcohol Use: No  . Drug Use: No  . Sexually Active: Not on file   Other Topics Concern  . Not on file   Social History Narrative  . No narrative on file    Current Outpatient Prescriptions  on File Prior to Visit  Medication Sig Dispense Refill  . aspirin EC 81 MG EC tablet Take two tablets daily      . carvedilol (COREG) 12.5 MG tablet Take 12.5 mg by mouth 2 (two) times daily.        . Cinnamon Oil OIL by Does not apply route. 1000 two times a day       . cyclobenzaprine (FLEXERIL) 5 MG tablet Take 1 tablet (5 mg total) by mouth every 8 (eight) hours as needed for muscle spasms.  30 tablet  2  . enalapril (VASOTEC) 5 MG tablet TAKE ONE & ONE-HALF TABLETS BY MOUTH TWICE DAILY  180 tablet  2  . fish oil-omega-3 fatty acids 1000 MG capsule Take 1 capsule by mouth 2 (two) times daily.        . Garlic-Parsley 409-81 MG CAPS Take 1 capsule by mouth daily.        Marland Kitchen glucose blood test strip 4X a day, and lancets 250.030  Variable glucoses       . spironolactone (ALDACTONE) 25 MG tablet Take 25 mg by mouth daily.        . vitamin E 400 UNIT capsule 2 capsules daily         Allergies  Allergen  Reactions  . Codeine     Family History  Problem Relation Age of Onset  . Heart attack Father 24  . Arthritis    . Heart disease      both parents  . Diabetes      grandparent  . Hyperlipidemia    . Hypertension    . Lung cancer    . Stroke      BP 122/64  Pulse 67  Temp(Src) 97.8 F (36.6 C) (Oral)  Ht 5\' 9"  (1.753 m)  Wt 201 lb 3.2 oz (91.264 kg)  BMI 29.71 kg/m2  SpO2 98%   Review of Systems denies hypoglycemia    Objective:   Physical Exam VITAL SIGNS:  See vs page GENERAL: no distress SKIN:  Insulin injection sites at the anterior abdomen still have hypertrophy of sq fat, but decreased since last ov here.   Lab Results  Component Value Date   HGBA1C 8.0* 06/03/2011      Assessment & Plan:  Type 1 DM.  He is doing better on this simpler regimen

## 2011-06-03 NOTE — Patient Instructions (Addendum)
check your blood sugar 4 times a day--before the 3 meals, and at bedtime.  also check if you have symptoms of your blood sugar being too high or too low.  please keep a record of the readings and bring it to your next appointment here.  please call us sooner if you are having low blood sugar episodes. blood tests are being ordered for you today.  please call [phone] to hear your test results.  You will be prompted to enter the 9-digit "MRN" number that appears at the top left of this page, followed by #.  Then you will hear the message. pending the test results, please increase lantus to 70 units each morning. continue novolog only:  4x a day (before meals, and at bedtime):  < 300:  none. 300's:  2 units. over 400:  4 units. Also, take novolog 8 units with supper (in addition to the as-needed).  (update: i left message on phone-tree:  Increase supper novolog to 9)

## 2011-06-05 ENCOUNTER — Other Ambulatory Visit: Payer: Self-pay

## 2011-06-05 ENCOUNTER — Other Ambulatory Visit: Payer: Self-pay | Admitting: *Deleted

## 2011-06-05 MED ORDER — INSULIN ASPART 100 UNIT/ML ~~LOC~~ SOLN: SUBCUTANEOUS | Status: DC

## 2011-06-05 MED ORDER — NITROGLYCERIN 0.4 MG SL SUBL: 0.4000 mg | SUBLINGUAL_TABLET | SUBLINGUAL | Status: DC | PRN

## 2011-06-05 MED ORDER — INSULIN GLARGINE 100 UNIT/ML ~~LOC~~ SOLN: 70.0000 [IU] | Freq: Every day | SUBCUTANEOUS | Status: DC

## 2011-06-05 NOTE — Telephone Encounter (Signed)
R'cd fax from [facility] needing specific directions for Novolog Flexpen in order to bill insurance. Rx sent with directions MD provided.

## 2011-06-06 ENCOUNTER — Other Ambulatory Visit: Payer: Self-pay | Admitting: *Deleted

## 2011-06-06 MED ORDER — INSULIN ASPART 100 UNIT/ML ~~LOC~~ SOLN: SUBCUTANEOUS | Status: DC

## 2011-06-06 NOTE — Telephone Encounter (Signed)
Brenton Grills, [facility] 06/05/2011 4:56 PM Signed  R'cd fax from [facility] needing specific directions for Novolog Flexpen in order to [employer]. Rx sent with directions MD provided.   Rx printed yesterday-resent electronically this morning.

## 2011-08-04 ENCOUNTER — Other Ambulatory Visit: Payer: Self-pay | Admitting: Cardiology

## 2011-08-13 ENCOUNTER — Telehealth: Payer: Self-pay

## 2011-08-13 MED ORDER — VARDENAFIL HCL 20 MG PO TABS: 20.0000 mg | ORAL_TABLET | ORAL | Status: DC | PRN

## 2011-08-13 NOTE — Telephone Encounter (Signed)
Pt called requesting Rx to treat erectile dysfunction, please advise in Dr George Hugh absence, thanks!

## 2011-08-13 NOTE — Telephone Encounter (Signed)
Ok for [employer] prn,done per [employer]

## 2011-08-13 NOTE — Telephone Encounter (Signed)
Patient informed. 

## 2011-08-29 ENCOUNTER — Ambulatory Visit: Payer: 59 | Admitting: Endocrinology

## 2011-10-08 ENCOUNTER — Other Ambulatory Visit: Payer: Self-pay | Admitting: Cardiology

## 2011-11-07 ENCOUNTER — Telehealth: Payer: Self-pay | Admitting: General Practice

## 2011-11-07 NOTE — Telephone Encounter (Signed)
What ED med are you on? Also, ov is due

## 2011-11-07 NOTE — Telephone Encounter (Signed)
Left message for pt's wife to callback office.

## 2011-11-07 NOTE — Telephone Encounter (Signed)
Erik Bird Rhett wife of patient Erik Bird dob 29-Dec-2060 wants to see if there is a new medication for ED.  She said that the medication that [name] is on has increased $200.00.

## 2011-11-10 NOTE — Telephone Encounter (Signed)
2 alternatives are also expensive.  [facility] no longer has it on its good-price list.  You should look around for a pharmacy in [place].  i'll be happy to give you a printed prescription when you come in for next ov

## 2011-11-10 NOTE — Telephone Encounter (Signed)
Pt's spouse informed of MD's advisement.  

## 2011-11-10 NOTE — Telephone Encounter (Signed)
Pt was previously taking Levitra. Cost has went up to $166 for 5 pills. They are asking for an alternative medication.

## 2011-12-01 ENCOUNTER — Telehealth: Payer: Self-pay | Admitting: Endocrinology

## 2011-12-01 NOTE — Telephone Encounter (Signed)
Caller: Erik Bird; PCP: Erik Bird; CB#: [phone]. Call regarding Questions for MD. Erik Bird calling to get a vial of Novalog insulin after she was advised the Flex pens [name] has been using will cost  them 216 dollars/month. She also wonders if samples of Novalog in vial are available. Pharmacy is [facility] on [address] They have syringes. She would also like to ask if PCP will consider beginning him on some Lyrica. Reports he was seen by Dr Jillyn Hidden who would like him to have MRI and CT scan, each costing 1500. Caller reports they can not afford to have these tests done. She saw a commercial on TV the other day for Lyrica for diabetic nerve pain and Dr Jillyn Hidden mentioned the same for hip pain and foot numbness. Erik Bird would like to know if Lyrica is an option for [employer]. She can be reached at above #.

## 2011-12-02 NOTE — Telephone Encounter (Signed)
Ov is due.  i would be happy to address each of these

## 2011-12-02 NOTE — Telephone Encounter (Signed)
Dr. Everardo All would like pt to come in for OV to address things in phone note. Can you please schedule an office vist per Dr. Everardo All. Thanks.

## 2011-12-03 ENCOUNTER — Other Ambulatory Visit: Payer: Self-pay | Admitting: Cardiology

## 2011-12-11 ENCOUNTER — Encounter: Payer: Self-pay | Admitting: Endocrinology

## 2011-12-11 ENCOUNTER — Ambulatory Visit (INDEPENDENT_AMBULATORY_CARE_PROVIDER_SITE_OTHER): Payer: 59 | Admitting: Endocrinology

## 2011-12-11 ENCOUNTER — Other Ambulatory Visit (INDEPENDENT_AMBULATORY_CARE_PROVIDER_SITE_OTHER): Payer: 59

## 2011-12-11 VITALS — BP 120/78 | HR 66 | Temp 98.5°F | Wt 201.0 lb

## 2011-12-11 DIAGNOSIS — E119 Type 2 diabetes mellitus without complications: Secondary | ICD-10-CM

## 2011-12-11 LAB — HEMOGLOBIN A1C: Hgb A1c MFr Bld: 9 % — ABNORMAL HIGH (ref 4.6–6.5)

## 2011-12-11 MED ORDER — GABAPENTIN 300 MG PO CAPS: 300.0000 mg | ORAL_CAPSULE | Freq: Two times a day (BID) | ORAL | Status: DC

## 2011-12-11 MED ORDER — SILDENAFIL CITRATE 100 MG PO TABS: 50.0000 mg | ORAL_TABLET | Freq: Every day | ORAL | Status: DC | PRN

## 2011-12-11 NOTE — Patient Instructions (Addendum)
check your blood sugar 4 times a day--before the 3 meals, and at bedtime.  also check if you have symptoms of your blood sugar being too high or too low.  please keep a record of the readings and bring it to your next appointment here.  please call us sooner if you are having low blood sugar episodes. blood tests are being ordered for you today.  please call [phone] to hear your test results.  You will be prompted to enter the 9-digit "MRN" number that appears at the top left of this page, followed by #.  Then you will hear the message. pending the test results, please continue lantus, 70 units each morning. continue novolog only:  4x a day (before meals, and at bedtime):  < 300:  none. 300's:  2 units. over 400:  4 units. Also, take novolog 9 units with supper (in addition to the as-needed).  Please come back for a regular physical appointment in 3 months Please start taking "gabapentin," to help your leg pain. It has a possible side effect of drowsiness.  This goes away with time.  You can avoid these by taking just take it at bedtime for the first week.  If the 2x a day does not help, call so i can raise it some more.

## 2011-12-11 NOTE — Progress Notes (Signed)
Subjective:    Patient ID: Erik Bird, male    DOB: 02/11/1961, 51 y.o.   MRN: [id number]  HPI Pt returns for f/u of type 1 DM (dx'ed 1998; complicated by CAD, PAD, and peripheral sensory neuropathy).  pt states he feels well in general.  he brings a record of his cbg's which i have reviewed today.  It varies from 93-170.  There is no trend throughout the day.   Pt states few years of moderate pain at both hips and lower extremities, and assoc numbness. Past Medical History  Diagnosis Date  . DM (diabetes mellitus)     on insulin.  Developed diabetes after a coxsackievirus infection.  . Ischemic cardiomyopathy     Echo (5/11) EF 30-35% with inferior and inferoseptal akinesis, posterior  hypokinesis, mild MR, mild to moderately decreased RV systolic function, dilated IVC.   Echo (6/11): EF  35-40%, inferior and inferoseptal akinesis, no significant valvular abnormalities.  Cardiac MRI (9/11): EF  42%, mildly dilated LV, mid to apical inferior and mid posterior 76-99% thickness subendocardial delayed  enhance  . CAD (coronary artery disease)     Inferior STEMI 5/11 complicated by cardiac arrest requiring CPR.  LHC showed  total occlusion proximal RCA, 60-70% proximal LAD at D1, and 60% ostial D1.  Patient had 2 overlapping  PROMUS stents to the RCA.  Marland Kitchen Hematuria 09/2009     Probably foley catheter trauma with hematuria   . Tobacco abuse   . Hyperlipidemia   . Carotid stenosis 10/2009    Carotid doppler (6/11): 0-39% bilateral ICA stenosis.    No past surgical history on file.  History   Social History  . Marital Status: Single    Spouse Name: N/A    Number of Children: 1  . Years of Education: N/A   Occupational History  . maintenance    Social History Main Topics  . Smoking status: Former Smoker -- 1.0 packs/day for 30 years    Types: Cigarettes    Quit date: 10/03/2009  . Smokeless tobacco: Not on file   Comment: quit smoking in 2011  . Alcohol Use: No  . Drug Use: No  .  Sexually Active: Not on file   Other Topics Concern  . Not on file   Social History Narrative  . No narrative on file    Current Outpatient Prescriptions on File Prior to Visit  Medication Sig Dispense Refill  . Ascorbic Acid (VITAMIN C) 1000 MG tablet Take 1,000 mg by mouth daily.      Marland Kitchen aspirin EC 81 MG EC tablet Take two tablets daily      . carvedilol (COREG) 12.5 MG tablet TAKE ONE TABLET BY MOUTH TWICE DAILY  180 tablet  1  . Cinnamon Oil OIL by Does not apply route. 1000 two times a day       . cyclobenzaprine (FLEXERIL) 5 MG tablet Take 1 tablet (5 mg total) by mouth every 8 (eight) hours as needed for muscle spasms.  30 tablet  2  . enalapril (VASOTEC) 5 MG tablet TAKE ONE & ONE-HALF TABLETS BY MOUTH TWICE DAILY  180 tablet  2  . fish oil-omega-3 fatty acids 1000 MG capsule Take 1 capsule by mouth 2 (two) times daily.        . Garlic-Parsley 409-81 MG CAPS Take 1 capsule by mouth daily.        . Glucosamine-Chondroitin-MSM (TRIPLE FLEX) 500-400-125 MG TABS Take 1 tablet by mouth daily.      Marland Kitchen  glucose blood test strip 4X a day, and lancets 250.030  Variable glucoses       . insulin aspart (NOVOLOG FLEXPEN) 100 UNIT/ML injection Inject into the skin 4x a day (before meals, and at bedtime) as directed:  < 300:  None. 300's:  2 units. over 400:  4 units. Take novolog 9 units with supper (in addition to the as-needed)  15 mL  2  . insulin glargine (LANTUS) 100 UNIT/ML injection Inject 80 Units into the skin daily.      . nitroGLYCERIN (NITROSTAT) 0.4 MG SL tablet Place 1 tablet (0.4 mg total) under the tongue every 5 (five) minutes as needed for chest pain. 1 SL as needed for chest pain  100 tablet  1  . spironolactone (ALDACTONE) 25 MG tablet TAKE ONE TABLET BY MOUTH EVERY DAY  90 tablet  1  . vitamin E 400 UNIT capsule 2 capsules daily       . atorvastatin (LIPITOR) 80 MG tablet Take 80 mg by mouth daily.       Marland Kitchen gabapentin (NEURONTIN) 300 MG capsule Take 1 capsule (300 mg total) by  mouth 2 (two) times daily.  180 capsule  3  . sildenafil (VIAGRA) 100 MG tablet Take 0.5-1 tablets (50-100 mg total) by mouth daily as needed for erectile dysfunction.  20 tablet  11  . vardenafil (LEVITRA) 20 MG tablet Take 1 tablet (20 mg total) by mouth as needed for erectile dysfunction.  5 tablet  5    Allergies  Allergen Reactions  . Codeine     Family History  Problem Relation Age of Onset  . Heart attack Father 41  . Arthritis    . Heart disease      both parents  . Diabetes      grandparent  . Hyperlipidemia    . Hypertension    . Lung cancer    . Stroke      BP 120/78  Pulse 66  Temp 98.5 F (36.9 C) (Oral)  Wt 201 lb (91.173 kg)  SpO2 98%    Review of Systems denies hypoglycemia.     Objective:   Physical Exam VITAL SIGNS:  See vs page. GENERAL: no distress.   Pulses: dorsalis pedis intact bilat.   Feet: no deformity.  no ulcer on the feet.  feet are of normal color and temp.  no edema Neuro: sensation is intact to touch on the feet, but slightly decreased from normal.    Lab Results  Component Value Date   HGBA1C 9.0* 12/11/2011      Assessment & Plan:  DM, therapy limited by pt's need for a simple regimen.  needs increased rx LE pain, which may have a combination of orthopedic and neuropathic causes, new

## 2011-12-12 ENCOUNTER — Telehealth: Payer: Self-pay | Admitting: *Deleted

## 2011-12-12 ENCOUNTER — Encounter: Payer: Self-pay | Admitting: Endocrinology

## 2011-12-12 NOTE — Telephone Encounter (Signed)
Called pt to inform of lab results, home number unavailable (# is disconnect per message-letter also mailed to pt).

## 2011-12-15 ENCOUNTER — Telehealth: Payer: Self-pay | Admitting: Endocrinology

## 2011-12-15 NOTE — Telephone Encounter (Signed)
Pt's spouse informed of lab results. 

## 2011-12-15 NOTE — Telephone Encounter (Signed)
The pt's wife called the triage line hoping to get lab results for the pt.  Called the pt's wife back, there was no answer, lvmom.  On the voice mail, told the pt a letter was mailed with the results and that the A1C was high, so a insulin increase was needed.  I asked them to call back as soon as possible to discuss these changes with Morrie Sheldon.  Thanks!

## 2011-12-15 NOTE — Telephone Encounter (Signed)
Pt's wife informed of MD's advisement regarding Lantus and Novolog.

## 2011-12-15 NOTE — Telephone Encounter (Signed)
The pt's wife called back on the triage line.  I read what was on the letter.  The patient's wife had further questions, so I transferred the call to [place].  Thanks!

## 2012-01-22 ENCOUNTER — Telehealth: Payer: Self-pay | Admitting: Endocrinology

## 2012-01-22 MED ORDER — GABAPENTIN 600 MG PO TABS: 600.0000 mg | ORAL_TABLET | Freq: Two times a day (BID) | ORAL | Status: DC

## 2012-01-22 NOTE — Telephone Encounter (Signed)
Please double the gabapentin to 600 mg bid.  i have sent a prescription to your pharmacy Have you seen ortho for this?  If not, i would be happy to refer you.

## 2012-01-22 NOTE — Telephone Encounter (Signed)
Caller: Janice/Spouse; Patient Name: Erik Bird; PCP: Romero Belling (Adults only); Best Callback Phone Number: [phone] Wife is calling for husband, states he is not available to call for triage and he is not with her.  Pt is on Neurontin for hip pain. Wife reports it does not help him for very long when he has to sit.  He takes it twice a day. Wife wants to know if dosage can be adjusted.  Pharmacy [facility] [phone].

## 2012-01-23 NOTE — Telephone Encounter (Signed)
ok 

## 2012-01-23 NOTE — Telephone Encounter (Signed)
Wife notified per MD. She states that yes pt has seen ortho and they are asking $1500 per test.

## 2012-03-17 ENCOUNTER — Other Ambulatory Visit: Payer: Self-pay | Admitting: Endocrinology

## 2012-04-21 ENCOUNTER — Telehealth: Payer: Self-pay | Admitting: Cardiology

## 2012-04-21 NOTE — Telephone Encounter (Signed)
New Problem: ° ° ° °I called the patient and was unable to reach them. I left a message on their voicemail with my name, the reason I called, the name of their physician, and a number to call back to schedule their appointment. ° °

## 2012-05-10 ENCOUNTER — Telehealth: Payer: Self-pay | Admitting: Endocrinology

## 2012-05-10 MED ORDER — INSULIN ASPART 100 UNIT/ML ~~LOC~~ SOLN: SUBCUTANEOUS | Status: DC

## 2012-05-10 MED ORDER — INSULIN GLARGINE 100 UNIT/ML ~~LOC~~ SOLN: 80.0000 [IU] | Freq: Every day | SUBCUTANEOUS | Status: DC

## 2012-05-10 NOTE — Telephone Encounter (Signed)
i refilled x 1 Ov is due 

## 2012-05-10 NOTE — Telephone Encounter (Signed)
The patient's wife came in to state that the patient's Lantus solastar was over $300 when the patient's wife went to pick it up from the pharmacy.  This is a financial hardship for the patient and the patient and his wife are wanting to know if they can get the Lantus in the vial and get samples of the Lantus flex pen if possible.  The patient's Novalog flex pen is over $100 as well.  Is the patient able to get Novalog flex pen samples as well?  Please call the patient or his wife to discuss.  The patient phone # is [phone] and his wife: [phone].

## 2012-05-11 ENCOUNTER — Telehealth: Payer: Self-pay

## 2012-05-11 MED ORDER — INSULIN ASPART 100 UNIT/ML ~~LOC~~ SOLN: SUBCUTANEOUS | Status: DC

## 2012-05-11 MED ORDER — INSULIN GLARGINE 100 UNIT/ML ~~LOC~~ SOLN: 80.0000 [IU] | Freq: Every day | SUBCUTANEOUS | Status: DC

## 2012-05-11 NOTE — Telephone Encounter (Signed)
Pt wants vials and needs actual rx so they can send to mail order

## 2012-05-11 NOTE — Telephone Encounter (Signed)
Does pt want to change pens to vials, or does he want to change to different insulin altogether?

## 2012-05-11 NOTE — Telephone Encounter (Signed)
i printed 

## 2012-05-11 NOTE — Telephone Encounter (Signed)
Pt not calling regarding refill is calling regarding the price of the insulin.  It is now over $500 for the lantus and novalog each month.  What can be done?  Change insulin or give samples?  Pt has ov scheduled for Jan. 17th.

## 2012-05-21 ENCOUNTER — Ambulatory Visit (INDEPENDENT_AMBULATORY_CARE_PROVIDER_SITE_OTHER): Payer: 59 | Admitting: Endocrinology

## 2012-05-21 ENCOUNTER — Encounter: Payer: Self-pay | Admitting: Endocrinology

## 2012-05-21 VITALS — BP 136/74 | HR 70 | Wt 206.0 lb

## 2012-05-21 DIAGNOSIS — E119 Type 2 diabetes mellitus without complications: Secondary | ICD-10-CM

## 2012-05-21 LAB — HEMOGLOBIN A1C: Hgb A1c MFr Bld: 9.3 % — ABNORMAL HIGH (ref 4.6–6.5)

## 2012-05-21 MED ORDER — HYDROCODONE-ACETAMINOPHEN 10-325 MG PO TABS: 1.0000 | ORAL_TABLET | Freq: Every day | ORAL | Status: DC | PRN

## 2012-05-21 NOTE — Progress Notes (Signed)
Subjective:    Patient ID: Erik Bird, male    DOB: [birth year], 52 y.o.   MRN: [id number]  HPI Pt returns for f/u of type 1 DM (dx'ed 1998; complicated by CAD, PAD, and peripheral sensory neuropathy).  he brings a record of his cbg's which i have reviewed today.  It varies from 100-160.  It is highest in am, due to large evening meal.   Pt states few years of severe pain at the legs, worse in the context of inactivity.  it has not improved with neurontin.  He has slight assoc numbness.  Pt could not afford to do mri that was ordered.   Past Medical History  Diagnosis Date  . DM (diabetes mellitus)     on insulin.  Developed diabetes after a coxsackievirus infection.  . Ischemic cardiomyopathy     Echo (5/11) EF 30-35% with inferior and inferoseptal akinesis, posterior  hypokinesis, mild MR, mild to moderately decreased RV systolic function, dilated IVC.   Echo (6/11): EF  35-40%, inferior and inferoseptal akinesis, no significant valvular abnormalities.  Cardiac MRI (9/11): EF  42%, mildly dilated LV, mid to apical inferior and mid posterior 76-99% thickness subendocardial delayed  enhance  . CAD (coronary artery disease)     Inferior STEMI 5/11 complicated by cardiac arrest requiring CPR.  LHC showed  total occlusion proximal RCA, 60-70% proximal LAD at D1, and 60% ostial D1.  Patient had 2 overlapping  PROMUS stents to the RCA.  Marland Kitchen Hematuria 09/2009     Probably foley catheter trauma with hematuria   . Tobacco abuse   . Hyperlipidemia   . Carotid stenosis 10/2009    Carotid doppler (6/11): 0-39% bilateral ICA stenosis.    No past surgical history on file.  History   Social History  . Marital Status: Single    Spouse Name: N/A    Number of Children: 1  . Years of Education: N/A   Occupational History  . maintenance    Social History Main Topics  . Smoking status: Former Smoker -- 1.0 packs/day for 30 years    Types: Cigarettes    Quit date: 10/03/2009  . Smokeless tobacco:  Not on file     Comment: quit smoking in 2011  . Alcohol Use: No  . Drug Use: No  . Sexually Active: Not on file   Other Topics Concern  . Not on file   Social History Narrative  . No narrative on file    Current Outpatient Prescriptions on File Prior to Visit  Medication Sig Dispense Refill  . Ascorbic Acid (VITAMIN C) 1000 MG tablet Take 1,000 mg by mouth daily.      Marland Kitchen aspirin EC 81 MG EC tablet Take two tablets daily      . atorvastatin (LIPITOR) 80 MG tablet Take 80 mg by mouth daily.       . carvedilol (COREG) 12.5 MG tablet TAKE ONE TABLET BY MOUTH TWICE DAILY  180 tablet  1  . Cinnamon Oil OIL by Does not apply route. 1000 two times a day       . cyclobenzaprine (FLEXERIL) 5 MG tablet TAKE ONE TABLET BY MOUTH EVERY 8 HOURS AS NEEDED FOR MUSCLE SPASM  30 tablet  0  . enalapril (VASOTEC) 5 MG tablet TAKE ONE & ONE-HALF TABLETS BY MOUTH TWICE DAILY  180 tablet  2  . fish oil-omega-3 fatty acids 1000 MG capsule Take 1 capsule by mouth 2 (two) times daily.        Marland Kitchen  gabapentin (NEURONTIN) 600 MG tablet Take 1 tablet (600 mg total) by mouth 2 (two) times daily.  60 tablet  11  . Garlic-Parsley 409-81 MG CAPS Take 1 capsule by mouth daily.        . Glucosamine-Chondroitin-MSM (TRIPLE FLEX) 500-400-125 MG TABS Take 1 tablet by mouth daily.      Marland Kitchen glucose blood test strip 4X a day, and lancets 250.030  Variable glucoses       . insulin glargine (LANTUS) 100 UNIT/ML injection Inject 80 Units into the skin daily.  30 mL  3  . nitroGLYCERIN (NITROSTAT) 0.4 MG SL tablet Place 1 tablet (0.4 mg total) under the tongue every 5 (five) minutes as needed for chest pain. 1 SL as needed for chest pain  100 tablet  1  . sildenafil (VIAGRA) 100 MG tablet Take 0.5-1 tablets (50-100 mg total) by mouth daily as needed for erectile dysfunction.  20 tablet  11  . spironolactone (ALDACTONE) 25 MG tablet TAKE ONE TABLET BY MOUTH EVERY DAY  90 tablet  1  . vardenafil (LEVITRA) 20 MG tablet Take 1 tablet (20 mg  total) by mouth as needed for erectile dysfunction.  5 tablet  5  . vitamin E 400 UNIT capsule 2 capsules daily         Allergies  Allergen Reactions  . Codeine     Family History  Problem Relation Age of Onset  . Heart attack Father 18  . Arthritis    . Heart disease      both parents  . Diabetes      grandparent  . Hyperlipidemia    . Hypertension    . Lung cancer    . Stroke      BP 136/74  Pulse 70  Wt 206 lb (93.441 kg)  SpO2 96%  Review of Systems denies hypoglycemia and weight change.    Objective:   Physical Exam VITAL SIGNS:  See vs page. GENERAL: no distress.   Pulses: dorsalis pedis intact bilat.   Feet: no deformity.  no ulcer on the feet.  feet are of normal color and temp.  no edema Neuro: sensation is intact to touch on the feet, but slightly decreased from normal.    Lab Results  Component Value Date   HGBA1C 9.3* 05/21/2012      Assessment & Plan:  DM: therapy limited by pt's need for a simple regimen.  He needs increased rx Painful neuropathy, needs increased rx

## 2012-05-21 NOTE — Patient Instructions (Addendum)
Please come back for a regular physical appointment in 3 months.   Here is a prescription for pain medication.  You should not take this while working.   check your blood sugar 4 times a day--before the 3 meals, and at bedtime.  also check if you have symptoms of your blood sugar being too high or too low.  please keep a record of the readings and bring it to your next appointment here.  please call us sooner if your blood sugar goes below 70, or if you have a lot of readings over 200.   Please continue the same lantus.   Try taking 10 units of novolog with the evening meal.

## 2012-05-24 ENCOUNTER — Telehealth: Payer: Self-pay | Admitting: Endocrinology

## 2012-05-24 NOTE — Telephone Encounter (Signed)
The patient's wife called to get A1c result.  Result of 9.3 given.  Patient aware of instructions given by Gilmer Mor, CMA per Dr. George Hugh message.  This is an [place] message only, no call back needed.

## 2012-06-19 ENCOUNTER — Other Ambulatory Visit: Payer: Self-pay

## 2012-08-08 ENCOUNTER — Other Ambulatory Visit: Payer: Self-pay | Admitting: Endocrinology

## 2012-08-09 ENCOUNTER — Other Ambulatory Visit: Payer: Self-pay | Admitting: *Deleted

## 2012-08-09 MED ORDER — INSULIN GLARGINE 100 UNIT/ML ~~LOC~~ SOLN: 80.0000 [IU] | Freq: Every day | SUBCUTANEOUS | Status: DC

## 2012-08-27 ENCOUNTER — Encounter: Payer: 59 | Admitting: Endocrinology

## 2012-08-27 DIAGNOSIS — Z0289 Encounter for other administrative examinations: Secondary | ICD-10-CM

## 2012-10-12 ENCOUNTER — Telehealth: Payer: Self-pay

## 2012-10-12 NOTE — Telephone Encounter (Signed)
Ov is due.  Let's address then 

## 2012-10-12 NOTE — Telephone Encounter (Signed)
Pt states ins. Co. request pt to switch Humalog kwik pen, instead of vial, pleas advise

## 2012-10-18 ENCOUNTER — Ambulatory Visit (INDEPENDENT_AMBULATORY_CARE_PROVIDER_SITE_OTHER): Payer: 59 | Admitting: Endocrinology

## 2012-10-18 ENCOUNTER — Encounter: Payer: Self-pay | Admitting: Endocrinology

## 2012-10-18 VITALS — BP 128/66 | HR 75 | Ht 69.0 in | Wt 204.0 lb

## 2012-10-18 DIAGNOSIS — E785 Hyperlipidemia, unspecified: Secondary | ICD-10-CM

## 2012-10-18 DIAGNOSIS — Z125 Encounter for screening for malignant neoplasm of prostate: Secondary | ICD-10-CM

## 2012-10-18 DIAGNOSIS — Z79899 Other long term (current) drug therapy: Secondary | ICD-10-CM

## 2012-10-18 DIAGNOSIS — E119 Type 2 diabetes mellitus without complications: Secondary | ICD-10-CM

## 2012-10-18 DIAGNOSIS — E1049 Type 1 diabetes mellitus with other diabetic neurological complication: Secondary | ICD-10-CM

## 2012-10-18 MED ORDER — INSULIN LISPRO 100 UNIT/ML (KWIKPEN): 10.0000 [IU] | PEN_INJECTOR | Freq: Every day | SUBCUTANEOUS | Status: DC

## 2012-10-18 MED ORDER — GABAPENTIN 600 MG PO TABS: 600.0000 mg | ORAL_TABLET | Freq: Three times a day (TID) | ORAL | Status: DC

## 2012-10-18 NOTE — Progress Notes (Signed)
Subjective:    Patient ID: Erik Bird, male    DOB: Jun 03, 1960, 52 y.o.   MRN: [id number]  HPI Pt returns for f/u of type 1 DM (dx'ed 1998; he has moderate neuropathy of the lower extremities; he has associated CAD, PAD; last episode of severe hypoglycemia was in 2009; he has had DKA only once, at time of dx).  no cbg record, but states cbg's vary from 70-203.  It is in general higher as the day goes on.  He often takes less than the prescribed dosage of novolog, out of fear of hypoglycemia. Past Medical History  Diagnosis Date  . DM (diabetes mellitus)     on insulin.  Developed diabetes after a coxsackievirus infection.  . Ischemic cardiomyopathy     Echo (5/11) EF 30-35% with inferior and inferoseptal akinesis, posterior  hypokinesis, mild MR, mild to moderately decreased RV systolic function, dilated IVC.   Echo (6/11): EF  35-40%, inferior and inferoseptal akinesis, no significant valvular abnormalities.  Cardiac MRI (9/11): EF  42%, mildly dilated LV, mid to apical inferior and mid posterior 76-99% thickness subendocardial delayed  enhance  . CAD (coronary artery disease)     Inferior STEMI 5/11 complicated by cardiac arrest requiring CPR.  LHC showed  total occlusion proximal RCA, 60-70% proximal LAD at D1, and 60% ostial D1.  Patient had 2 overlapping  PROMUS stents to the RCA.  Marland Kitchen Hematuria 09/2009     Probably foley catheter trauma with hematuria   . Tobacco abuse   . Hyperlipidemia   . Carotid stenosis 10/2009    Carotid doppler (6/11): 0-39% bilateral ICA stenosis.    No past surgical history on file.  History   Social History  . Marital Status: Single    Spouse Name: N/A    Number of Children: 1  . Years of Education: N/A   Occupational History  . maintenance    Social History Main Topics  . Smoking status: Former Smoker -- 1.00 packs/day for 30 years    Types: Cigarettes    Quit date: 10/03/2009  . Smokeless tobacco: Not on file     Comment: quit smoking in 2011   . Alcohol Use: No  . Drug Use: No  . Sexually Active: Not on file   Other Topics Concern  . Not on file   Social History Narrative  . No narrative on file    Current Outpatient Prescriptions on File Prior to Visit  Medication Sig Dispense Refill  . Ascorbic Acid (VITAMIN C) 1000 MG tablet Take 1,000 mg by mouth daily.      Marland Kitchen aspirin EC 81 MG EC tablet Take two tablets daily      . atorvastatin (LIPITOR) 80 MG tablet Take 80 mg by mouth daily.       . carvedilol (COREG) 12.5 MG tablet TAKE ONE TABLET BY MOUTH TWICE DAILY  180 tablet  1  . Cinnamon Oil OIL by Does not apply route. 1000 two times a day       . cyclobenzaprine (FLEXERIL) 5 MG tablet TAKE ONE TABLET BY MOUTH EVERY 8 HOURS AS NEEDED FOR MUSCLE SPASM  30 tablet  0  . enalapril (VASOTEC) 5 MG tablet TAKE ONE & ONE-HALF TABLETS BY MOUTH TWICE DAILY  180 tablet  2  . fish oil-omega-3 fatty acids 1000 MG capsule Take 1 capsule by mouth 2 (two) times daily.        . Garlic-Parsley 829-56 MG CAPS Take 1 capsule by mouth  daily.        . Glucosamine-Chondroitin-MSM (TRIPLE FLEX) 500-400-125 MG TABS Take 1 tablet by mouth daily.      Marland Kitchen glucose blood test strip 4X a day, and lancets 250.030  Variable glucoses       . HYDROcodone-acetaminophen (NORCO) 10-325 MG per tablet Take 1 tablet by mouth daily as needed for pain.  50 tablet  2  . LANTUS 100 UNIT/ML injection INJECT  80 UNITS SUBCUTANEOUSLY EVERY DAY  30 mL  3  . nitroGLYCERIN (NITROSTAT) 0.4 MG SL tablet Place 1 tablet (0.4 mg total) under the tongue every 5 (five) minutes as needed for chest pain. 1 SL as needed for chest pain  100 tablet  1  . spironolactone (ALDACTONE) 25 MG tablet TAKE ONE TABLET BY MOUTH EVERY DAY  90 tablet  1  . vitamin E 400 UNIT capsule 2 capsules daily       . sildenafil (VIAGRA) 100 MG tablet Take 0.5-1 tablets (50-100 mg total) by mouth daily as needed for erectile dysfunction.  20 tablet  11  . vardenafil (LEVITRA) 20 MG tablet Take 1 tablet (20 mg  total) by mouth as needed for erectile dysfunction.  5 tablet  5   No current facility-administered medications on file prior to visit.    Allergies  Allergen Reactions  . Codeine     Family History  Problem Relation Age of Onset  . Heart attack Father 65  . Arthritis    . Heart disease      both parents  . Diabetes      grandparent  . Hyperlipidemia    . Hypertension    . Lung cancer    . Stroke      BP 128/66  Pulse 75  Ht 5\' 9"  (1.753 m)  Wt 204 lb (92.534 kg)  BMI 30.11 kg/m2  SpO2 98%  Review of Systems denies hypoglycemia and weight change.     Objective:   Physical Exam VITAL SIGNS:  See vs page GENERAL: no distress  Lab Results  Component Value Date   WBC 5.2 10/18/2012   HGB 13.8 10/18/2012   HCT 40.8 10/18/2012   PLT 240.0 10/18/2012   GLUCOSE 74 10/18/2012   CHOL 178 10/18/2012   TRIG 125.0 10/18/2012   HDL 43.70 10/18/2012   LDLCALC 109* 10/18/2012   ALT 19 10/18/2012   AST 24 10/18/2012   NA 143 10/18/2012   K 4.2 10/18/2012   CL 108 10/18/2012   CREATININE 1.0 10/18/2012   BUN 16 10/18/2012   CO2 30 10/18/2012   TSH 3.83 10/18/2012   PSA 1.01 10/18/2012   INR 1.59* 09/16/2009   HGBA1C 7.4* 10/18/2012   MICROALBUR 38.6* 10/18/2012      Assessment & Plan:  DM: this is the best control this pt should aim for, given this regimen, which does match insulin to his changing needs throughout the day.  This insulin regimen was chosen from multiple options, for its simplicity.  The benefits of glycemic control must be weighed against the risks of hypoglycemia.  Dyslipidemia: needs increased rx

## 2012-10-18 NOTE — Patient Instructions (Addendum)
Please come back for a regular physical appointment in 3 months.   check your blood sugar 4 times a day--before the 3 meals, and at bedtime.  also check if you have symptoms of your blood sugar being too high or too low.  please keep a record of the readings and bring it to your next appointment here.  please call us sooner if your blood sugar goes below 70, or if you have a lot of readings over 200.     i have sent a prescription to your pharmacy, to change the novolog to humalog.

## 2012-10-19 LAB — CBC WITH DIFFERENTIAL/PLATELET
Basophils Absolute: 0.1 10*3/uL (ref 0.0–0.1)
HCT: 40.8 % (ref 39.0–52.0)
Lymphs Abs: 1.8 10*3/uL (ref 0.7–4.0)
MCHC: 33.9 g/dL (ref 30.0–36.0)
MCV: 96.5 fl (ref 78.0–100.0)
Monocytes Absolute: 0.5 10*3/uL (ref 0.1–1.0)
Platelets: 240 10*3/uL (ref 150.0–400.0)
RDW: 12.6 % (ref 11.5–14.6)

## 2012-10-19 LAB — URINALYSIS, ROUTINE W REFLEX MICROSCOPIC
Bilirubin Urine: NEGATIVE
Hgb urine dipstick: NEGATIVE
Leukocytes, UA: NEGATIVE
Nitrite: NEGATIVE

## 2012-10-19 LAB — LIPID PANEL
HDL: 43.7 mg/dL (ref >39.00)
Total CHOL/HDL Ratio: 4
Triglycerides: 125 mg/dL (ref 0.0–149.0)
VLDL: 25 mg/dL (ref 0.0–40.0)

## 2012-10-19 LAB — BASIC METABOLIC PANEL
BUN: 16 mg/dL (ref 6–23)
CO2: 30 mEq/L (ref 19–32)
GFR: 87.72 mL/min (ref >60.00)
Glucose, Bld: 74 mg/dL (ref 70–99)
Potassium: 4.2 mEq/L (ref 3.5–5.1)

## 2012-10-19 LAB — HEPATIC FUNCTION PANEL
ALT: 19 U/L (ref 0–53)
AST: 24 U/L (ref 0–37)
Bilirubin, Direct: 0.2 mg/dL (ref 0.0–0.3)
Total Protein: 6 g/dL (ref 6.0–8.3)

## 2012-10-20 LAB — MICROALBUMIN / CREATININE URINE RATIO: Microalb, Ur: 38.6 mg/dL — ABNORMAL HIGH (ref 0.0–1.9)

## 2012-11-24 ENCOUNTER — Telehealth: Payer: Self-pay | Admitting: *Deleted

## 2012-11-24 ENCOUNTER — Other Ambulatory Visit: Payer: Self-pay | Admitting: Endocrinology

## 2012-11-24 DIAGNOSIS — C73 Malignant neoplasm of thyroid gland: Secondary | ICD-10-CM

## 2012-11-24 NOTE — Telephone Encounter (Signed)
Completed.

## 2012-11-24 NOTE — Telephone Encounter (Signed)
Erik Bird called [facility]) and she spoke to [place] at the hospital, you need to put a thyrogen in the order.

## 2012-11-24 NOTE — Telephone Encounter (Signed)
Noted,

## 2012-11-26 ENCOUNTER — Other Ambulatory Visit: Payer: Self-pay | Admitting: Endocrinology

## 2012-11-26 DIAGNOSIS — C73 Malignant neoplasm of thyroid gland: Secondary | ICD-10-CM

## 2012-12-06 ENCOUNTER — Other Ambulatory Visit: Payer: Self-pay

## 2012-12-06 ENCOUNTER — Encounter (HOSPITAL_COMMUNITY): Admission: RE | Admit: 2012-12-06 | Payer: 59 | Source: Ambulatory Visit

## 2012-12-06 MED ORDER — HYDROCODONE-ACETAMINOPHEN 10-325 MG PO TABS: 1.0000 | ORAL_TABLET | Freq: Every day | ORAL | Status: DC | PRN

## 2012-12-07 ENCOUNTER — Telehealth: Payer: Self-pay | Admitting: Endocrinology

## 2012-12-07 ENCOUNTER — Encounter (HOSPITAL_COMMUNITY): Payer: 59

## 2012-12-07 NOTE — Telephone Encounter (Signed)
Pt advised rx sent. ?

## 2012-12-07 NOTE — Telephone Encounter (Signed)
Pt wants to know if you got a refill request for his pain meds yesterday?  He needs this refilled ASAP

## 2012-12-08 ENCOUNTER — Encounter (HOSPITAL_COMMUNITY): Payer: 59

## 2012-12-15 ENCOUNTER — Encounter (HOSPITAL_COMMUNITY): Payer: 59

## 2013-02-23 ENCOUNTER — Telehealth: Payer: Self-pay | Admitting: Endocrinology

## 2013-03-10 ENCOUNTER — Other Ambulatory Visit: Payer: Self-pay

## 2013-04-22 ENCOUNTER — Ambulatory Visit (INDEPENDENT_AMBULATORY_CARE_PROVIDER_SITE_OTHER): Payer: 59 | Admitting: Endocrinology

## 2013-04-22 ENCOUNTER — Encounter: Payer: Self-pay | Admitting: Endocrinology

## 2013-04-22 VITALS — BP 136/62 | HR 67 | Temp 98.4°F | Ht 69.0 in | Wt 210.0 lb

## 2013-04-22 DIAGNOSIS — E119 Type 2 diabetes mellitus without complications: Secondary | ICD-10-CM

## 2013-04-22 MED ORDER — HYDROCODONE-ACETAMINOPHEN 10-325 MG PO TABS: 1.0000 | ORAL_TABLET | Freq: Every day | ORAL | Status: DC | PRN

## 2013-04-22 NOTE — Patient Instructions (Addendum)
Please come back for a regular physical appointment in 3 months.   check your blood sugar 4 times a day--before the 3 meals, and at bedtime.  also check if you have symptoms of your blood sugar being too high or too low.  please keep a record of the readings and bring it to your next appointment here.  please call us sooner if your blood sugar goes below 70, or if you have a lot of readings over 200.     blood tests are being requested for you today.  We'll contact you with results.

## 2013-04-22 NOTE — Progress Notes (Signed)
Subjective:    Patient ID: Erik Bird, male    DOB: 1961-03-05, 52 y.o.   MRN: [id number]  HPI Pt returns for f/u of type 1 DM (dx'ed 1998; he has moderate neuropathy of the lower extremities; he has associated CAD, PAD; last episode of severe hypoglycemia was in 2009; he has had DKA only once, at time of dx; he has been on insulin since dx).  no cbg record, but states cbg's are well-controlled.  pt states he feels well in general.  Past Medical History  Diagnosis Date  . DM (diabetes mellitus)     on insulin.  Developed diabetes after a coxsackievirus infection.  . Ischemic cardiomyopathy     Echo (5/11) EF 30-35% with inferior and inferoseptal akinesis, posterior  hypokinesis, mild MR, mild to moderately decreased RV systolic function, dilated IVC.   Echo (6/11): EF  35-40%, inferior and inferoseptal akinesis, no significant valvular abnormalities.  Cardiac MRI (9/11): EF  42%, mildly dilated LV, mid to apical inferior and mid posterior 76-99% thickness subendocardial delayed  enhance  . CAD (coronary artery disease)     Inferior STEMI 5/11 complicated by cardiac arrest requiring CPR.  LHC showed  total occlusion proximal RCA, 60-70% proximal LAD at D1, and 60% ostial D1.  Patient had 2 overlapping  PROMUS stents to the RCA.  Marland Kitchen Hematuria 09/2009     Probably foley catheter trauma with hematuria   . Tobacco abuse   . Hyperlipidemia   . Carotid stenosis 10/2009    Carotid doppler (6/11): 0-39% bilateral ICA stenosis.    No past surgical history on file.  History   Social History  . Marital Status: Single    Spouse Name: N/A    Number of Children: 1  . Years of Education: N/A   Occupational History  . maintenance    Social History Main Topics  . Smoking status: Former Smoker -- 1.00 packs/day for 30 years    Types: Cigarettes    Quit date: 10/03/2009  . Smokeless tobacco: Not on file     Comment: quit smoking in 2011  . Alcohol Use: No  . Drug Use: No  . Sexual Activity:  Not on file   Other Topics Concern  . Not on file   Social History Narrative  . No narrative on file    Current Outpatient Prescriptions on File Prior to Visit  Medication Sig Dispense Refill  . Ascorbic Acid (VITAMIN C) 1000 MG tablet Take 1,000 mg by mouth daily.      Marland Kitchen aspirin EC 81 MG EC tablet Take two tablets daily      . atorvastatin (LIPITOR) 80 MG tablet Take 80 mg by mouth daily.       . carvedilol (COREG) 12.5 MG tablet TAKE ONE TABLET BY MOUTH TWICE DAILY  180 tablet  1  . Cinnamon Oil OIL by Does not apply route. 1000 two times a day       . cyclobenzaprine (FLEXERIL) 5 MG tablet TAKE ONE TABLET BY MOUTH EVERY 8 HOURS AS NEEDED FOR MUSCLE SPASM  30 tablet  0  . enalapril (VASOTEC) 5 MG tablet TAKE ONE & ONE-HALF TABLETS BY MOUTH TWICE DAILY  180 tablet  2  . fish oil-omega-3 fatty acids 1000 MG capsule Take 1 capsule by mouth 2 (two) times daily.        Marland Kitchen gabapentin (NEURONTIN) 600 MG tablet Take 1 tablet (600 mg total) by mouth 3 (three) times daily.  90 tablet  11  . Garlic-Parsley 161-09 MG CAPS Take 1 capsule by mouth daily.        . Glucosamine-Chondroitin-MSM (TRIPLE FLEX) 500-400-125 MG TABS Take 1 tablet by mouth daily.      Marland Kitchen glucose blood test strip 4X a day, and lancets 250.030  Variable glucoses       . insulin glargine (LANTUS) 100 UNIT/ML injection Inject 75 Units into the skin daily.      . insulin lispro (HUMALOG KWIKPEN) 100 unit/mL SOLN Inject 10 Units into the skin daily. And pen needles 1/day  5 pen  11  . nitroGLYCERIN (NITROSTAT) 0.4 MG SL tablet Place 1 tablet (0.4 mg total) under the tongue every 5 (five) minutes as needed for chest pain. 1 SL as needed for chest pain  100 tablet  1  . spironolactone (ALDACTONE) 25 MG tablet TAKE ONE TABLET BY MOUTH EVERY DAY  90 tablet  1  . vitamin E 400 UNIT capsule 2 capsules daily       . sildenafil (VIAGRA) 100 MG tablet Take 0.5-1 tablets (50-100 mg total) by mouth daily as needed for erectile dysfunction.  20  tablet  11  . vardenafil (LEVITRA) 20 MG tablet Take 1 tablet (20 mg total) by mouth as needed for erectile dysfunction.  5 tablet  5   No current facility-administered medications on file prior to visit.    Allergies  Allergen Reactions  . Codeine     Family History  Problem Relation Age of Onset  . Heart attack Father 71  . Arthritis    . Heart disease      both parents  . Diabetes      grandparent  . Hyperlipidemia    . Hypertension    . Lung cancer    . Stroke     BP 136/62  Pulse 67  Temp(Src) 98.4 F (36.9 C) (Oral)  Ht 5\' 9"  (1.753 m)  Wt 210 lb (95.255 kg)  BMI 31.00 kg/m2  SpO2 97%  Review of Systems He has weight gain.  He denies hypoglycemia.      Objective:   Physical Exam VITAL SIGNS:  See vs page GENERAL: no distress  Lab Results  Component Value Date   HGBA1C 8.0* 04/22/2013      Assessment & Plan:  DM: this is the best control this pt should aim for, given this regimen, which does match insulin to his changing needs throughout the day.  This insulin regimen was chosen from multiple options, for its simplicity.  The benefits of glycemic control must be weighed against the risks of hypoglycemia.  Neuropathy: this limits exercise rx of DM. CAD: in this context, he should avoid hypoglycemia.

## 2013-06-14 ENCOUNTER — Telehealth: Payer: Self-pay

## 2013-06-14 NOTE — Telephone Encounter (Signed)
Pt's wife called wanting to know if Dr thought it was a good idea for Pt to go see Chiropractor. Pt is having some issues with right hip again.  Please advise, Thanks!

## 2013-06-14 NOTE — Telephone Encounter (Signed)
You could, but we have a sports medicine specialist at the old office, who i think is the best option.

## 2013-06-17 NOTE — Telephone Encounter (Signed)
Pt to call back if interested in pursuing visit.

## 2013-07-12 ENCOUNTER — Telehealth: Payer: Self-pay | Admitting: Endocrinology

## 2013-07-12 DIAGNOSIS — Z1211 Encounter for screening for malignant neoplasm of colon: Secondary | ICD-10-CM

## 2013-07-12 NOTE — Telephone Encounter (Signed)
Pt would like an order to have a colonoscopy done at [facility].

## 2013-07-12 NOTE — Telephone Encounter (Signed)
Pts wife informed.

## 2013-07-12 NOTE — Telephone Encounter (Signed)
done

## 2013-07-12 NOTE — Telephone Encounter (Signed)
See below and please advise, Thanks!  

## 2013-07-20 ENCOUNTER — Telehealth: Payer: Self-pay | Admitting: Endocrinology

## 2013-07-20 MED ORDER — INSULIN LISPRO 100 UNIT/ML (KWIKPEN): 10.0000 [IU] | PEN_INJECTOR | Freq: Every day | SUBCUTANEOUS | Status: DC

## 2013-07-20 MED ORDER — INSULIN GLARGINE 100 UNIT/ML ~~LOC~~ SOLN: 75.0000 [IU] | Freq: Every day | SUBCUTANEOUS | Status: DC

## 2013-07-20 NOTE — Telephone Encounter (Signed)
Change pharmacy to [facility] [place] # [phone] please call in the insulin he needs

## 2013-07-20 NOTE — Telephone Encounter (Signed)
Pharmacy changed and medication refilled.  °

## 2013-08-15 ENCOUNTER — Encounter: Payer: Self-pay | Admitting: Internal Medicine

## 2013-08-15 ENCOUNTER — Telehealth: Payer: Self-pay | Admitting: Endocrinology

## 2013-08-15 NOTE — Telephone Encounter (Signed)
Please refill x 1 cpx is due 

## 2013-08-15 NOTE — Telephone Encounter (Signed)
Pt is requesting a refill on his Hydrocodone. Pt was last seen and medication was last filled on 04/22/2013. Thanks!

## 2013-08-15 NOTE — Telephone Encounter (Signed)
[facility] [address] [phone] please [facility] the [facility] for them now and replace with this one.  He needs hydrocodone(said he may need to come in to pick up), lantus, and humulin.

## 2013-08-15 NOTE — Telephone Encounter (Signed)
Insulin refilled. Unable to located Hydrocodone Rx. Please advise, Thanks!

## 2013-08-16 MED ORDER — INSULIN LISPRO 100 UNIT/ML (KWIKPEN): 10.0000 [IU] | PEN_INJECTOR | Freq: Every day | SUBCUTANEOUS | Status: DC

## 2013-08-16 MED ORDER — INSULIN GLARGINE 100 UNIT/ML ~~LOC~~ SOLN: 75.0000 [IU] | Freq: Every day | SUBCUTANEOUS | Status: DC

## 2013-08-16 NOTE — Telephone Encounter (Signed)
Done

## 2013-09-27 ENCOUNTER — Telehealth: Payer: Self-pay | Admitting: Endocrinology

## 2013-09-27 MED ORDER — INSULIN GLARGINE 100 UNIT/ML ~~LOC~~ SOLN: 75.0000 [IU] | Freq: Every day | SUBCUTANEOUS | Status: DC

## 2013-09-27 NOTE — Telephone Encounter (Signed)
Rx sent. Pt will need appointment for further refills.

## 2013-09-27 NOTE — Telephone Encounter (Signed)
[facility] l[facility]us [facility]

## 2013-10-07 ENCOUNTER — Encounter: Payer: 59 | Admitting: Internal Medicine

## 2013-10-14 ENCOUNTER — Ambulatory Visit: Payer: 59 | Admitting: Endocrinology

## 2013-10-28 ENCOUNTER — Ambulatory Visit: Payer: 59 | Admitting: Endocrinology

## 2013-11-03 ENCOUNTER — Other Ambulatory Visit: Payer: Self-pay | Admitting: Endocrinology

## 2013-11-07 ENCOUNTER — Encounter: Payer: Self-pay | Admitting: Endocrinology

## 2013-11-07 ENCOUNTER — Ambulatory Visit (INDEPENDENT_AMBULATORY_CARE_PROVIDER_SITE_OTHER): Payer: BC Managed Care – PPO | Admitting: Endocrinology

## 2013-11-07 VITALS — BP 136/84 | HR 69 | Temp 98.5°F | Ht 69.0 in | Wt 196.0 lb

## 2013-11-07 DIAGNOSIS — Z0000 Encounter for general adult medical examination without abnormal findings: Secondary | ICD-10-CM

## 2013-11-07 DIAGNOSIS — E119 Type 2 diabetes mellitus without complications: Secondary | ICD-10-CM

## 2013-11-07 DIAGNOSIS — Z125 Encounter for screening for malignant neoplasm of prostate: Secondary | ICD-10-CM

## 2013-11-07 DIAGNOSIS — Z79899 Other long term (current) drug therapy: Secondary | ICD-10-CM

## 2013-11-07 DIAGNOSIS — E785 Hyperlipidemia, unspecified: Secondary | ICD-10-CM

## 2013-11-07 DIAGNOSIS — I2510 Atherosclerotic heart disease of native coronary artery without angina pectoris: Secondary | ICD-10-CM

## 2013-11-07 DIAGNOSIS — Z119 Encounter for screening for infectious and parasitic diseases, unspecified: Secondary | ICD-10-CM | POA: Insufficient documentation

## 2013-11-07 DIAGNOSIS — E1049 Type 1 diabetes mellitus with other diabetic neurological complication: Secondary | ICD-10-CM

## 2013-11-07 MED ORDER — INSULIN GLARGINE 100 UNIT/ML SOLOSTAR PEN: 75.0000 [IU] | PEN_INJECTOR | SUBCUTANEOUS | Status: DC

## 2013-11-07 MED ORDER — HYDROCODONE-ACETAMINOPHEN 10-325 MG PO TABS: 1.0000 | ORAL_TABLET | Freq: Every day | ORAL | Status: DC | PRN

## 2013-11-07 NOTE — Progress Notes (Signed)
Subjective:    Patient ID: Erik Bird, male    DOB: 1960-06-08, 53 y.o.   MRN: [id number]  HPI Pt is here for regular wellness examination, and is feeling pretty well in general, and says chronic med probs are stable, except as noted below Past Medical History  Diagnosis Date  . DM (diabetes mellitus)     on insulin.  Developed diabetes after a coxsackievirus infection.  . Ischemic cardiomyopathy     Echo (5/11) EF 30-35% with inferior and inferoseptal akinesis, posterior  hypokinesis, mild MR, mild to moderately decreased RV systolic function, dilated IVC.   Echo (6/11): EF  35-40%, inferior and inferoseptal akinesis, no significant valvular abnormalities.  Cardiac MRI (9/11): EF  42%, mildly dilated LV, mid to apical inferior and mid posterior 76-99% thickness subendocardial delayed  enhance  . CAD (coronary artery disease)     Inferior STEMI 9/56 complicated by cardiac arrest requiring CPR.  LHC showed  total occlusion proximal RCA, 60-70% proximal LAD at D1, and 60% ostial D1.  Patient had 2 overlapping  PROMUS stents to the RCA.  Marland Kitchen Hematuria 09/2009     Probably foley catheter trauma with hematuria   . Tobacco abuse   . Hyperlipidemia   . Carotid stenosis 10/2009    Carotid doppler (6/11): 0-39% bilateral ICA stenosis.    No past surgical history on file.  History   Social History  . Marital Status: Single    Spouse Name: N/A    Number of Children: 1  . Years of Education: N/A   Occupational History  . maintenance    Social History Main Topics  . Smoking status: Former Smoker -- 1.00 packs/day for 30 years    Types: Cigarettes    Quit date: 10/03/2009  . Smokeless tobacco: Not on file     Comment: quit smoking in 2011  . Alcohol Use: No  . Drug Use: No  . Sexual Activity: Not on file   Other Topics Concern  . Not on file   Social History Narrative  . No narrative on file    Current Outpatient Prescriptions on File Prior to Visit  Medication Sig Dispense  Refill  . Ascorbic Acid (VITAMIN C) 1000 MG tablet Take 1,000 mg by mouth daily.      Marland Kitchen aspirin EC 81 MG EC tablet Take two tablets daily      . atorvastatin (LIPITOR) 80 MG tablet Take 80 mg by mouth daily.       . carvedilol (COREG) 12.5 MG tablet TAKE ONE TABLET BY MOUTH TWICE DAILY  180 tablet  1  . Cinnamon Oil OIL by Does not apply route. 1000 two times a day       . cyclobenzaprine (FLEXERIL) 5 MG tablet TAKE ONE TABLET BY MOUTH EVERY 8 HOURS AS NEEDED FOR MUSCLE SPASM  30 tablet  0  . enalapril (VASOTEC) 5 MG tablet TAKE ONE & ONE-HALF TABLETS BY MOUTH TWICE DAILY  180 tablet  2  . fish oil-omega-3 fatty acids 1000 MG capsule Take 1 capsule by mouth 2 (two) times daily.        Marland Kitchen gabapentin (NEURONTIN) 600 MG tablet Take 1 tablet (600 mg total) by mouth 3 (three) times daily.  90 tablet  11  . Garlic-Parsley 213-08 MG CAPS Take 1 capsule by mouth daily.        . Glucosamine-Chondroitin-MSM (TRIPLE FLEX) 500-400-125 MG TABS Take 1 tablet by mouth daily.      Marland Kitchen glucose  blood test strip 4X a day, and lancets 250.030  Variable glucoses       . nitroGLYCERIN (NITROSTAT) 0.4 MG SL tablet Place 1 tablet (0.4 mg total) under the tongue every 5 (five) minutes as needed for chest pain. 1 SL as needed for chest pain  100 tablet  1  . spironolactone (ALDACTONE) 25 MG tablet TAKE ONE TABLET BY MOUTH EVERY DAY  90 tablet  1  . vitamin E 400 UNIT capsule 2 capsules daily       . sildenafil (VIAGRA) 100 MG tablet Take 0.5-1 tablets (50-100 mg total) by mouth daily as needed for erectile dysfunction.  20 tablet  11  . vardenafil (LEVITRA) 20 MG tablet Take 1 tablet (20 mg total) by mouth as needed for erectile dysfunction.  5 tablet  5   No current facility-administered medications on file prior to visit.    Allergies  Allergen Reactions  . Codeine     Family History  Problem Relation Age of Onset  . Heart attack Father 25  . Arthritis    . Heart disease      both parents  . Diabetes       grandparent  . Hyperlipidemia    . Hypertension    . Lung cancer    . Stroke      BP 136/84  Pulse 69  Temp(Src) 98.5 F (36.9 C) (Oral)  Ht 5\' 9"  (1.753 m)  Wt 196 lb (88.905 kg)  BMI 28.93 kg/m2  SpO2 92%     Review of Systems  Constitutional: Negative for fever.  HENT: Negative for hearing loss.   Eyes: Negative for visual disturbance.  Respiratory: Negative for shortness of breath.   Cardiovascular: Negative for chest pain.  Gastrointestinal: Negative for anal bleeding.  Endocrine: Negative for cold intolerance.  Genitourinary: Negative for hematuria and difficulty urinating.  Musculoskeletal: Negative for back pain.  Skin: Negative for rash.  Allergic/Immunologic: Positive for environmental allergies.  Neurological: Negative for syncope and numbness.  Hematological: Does not bruise/bleed easily.  Psychiatric/Behavioral: Negative for decreased concentration.       Objective:   Physical Exam VS: see vs page GEN: no distress HEAD: head: no deformity eyes: no periorbital swelling, no proptosis external nose and ears are normal mouth: no lesion seen NECK: supple, thyroid is not enlarged CHEST WALL: no deformity LUNGS: clear to auscultation BREASTS:  No gynecomastia CV: reg rate and rhythm, no murmur ABD: abdomen is soft, nontender.  no hepatosplenomegaly.  not distended.  no hernia.   RECTAL: normal external and internal exam.  heme neg. PROSTATE:  Normal size.  No nodule MUSCULOSKELETAL: muscle bulk and strength are grossly normal.  no obvious joint swelling.  gait is normal and steady SKIN:  Normal texture and temperature.  No rash or suspicious lesion is visible.   NODES:  None palpable at the neck PSYCH: alert, well-oriented.  Does not appear anxious nor depressed.   i reviewed electrocardiogram: no change since 2012.      Assessment & Plan:  Wellness visit today, with problems stable, except as noted. Patient is advised the following: Patient  Instructions  please consider these measures for your health:  minimize alcohol.  do not use tobacco products.  have a colonoscopy at least every 10 years from age 59.  keep firearms safely stored.  always use seat belts.  have working smoke alarms in your home.  see an eye doctor and dentist regularly.  never drive under the influence of alcohol  or drugs (including prescription drugs).  those with fair skin should take precautions against the sun. Please avoid injecting in the the swollen areas of your stomach.   Please come back for a follow-up appointment in 3 months.   you will receive a phone call, about a day and time for an appointment, for a pre-colonoscopy appointment.   blood tests are being requested for you today.  We'll contact you with results.         SEPARATE EVALUATION FOLLOWS--EACH PROBLEM HERE IS NEW, NOT RESPONDING TO TREATMENT, OR POSES SIGNIFICANT RISK TO THE PATIENT'S HEALTH: HISTORY OF THE PRESENT ILLNESS: Pt returns for f/u of type 1 DM (dx'ed 1998; he has moderate neuropathy of the lower extremities; he has associated CAD, PAD; last episode of severe hypoglycemia was in 2009; he has had DKA only once, at time of dx; he has been on insulin since dx; he has never had pancreatitis; he was changed to QD insulin, after poor results on multiple daily injections).  no cbg record, but states cbg's are seldom low.  This happens in the early hrs of the am.  pt states he feels well in general.  He does not take lipitor.   PAST MEDICAL HISTORY reviewed and up to date today REVIEW OF SYSTEMS: He has gained a few lbs.  He denies hypoglycemia PHYSICAL EXAMINATION: SKIN:  Insulin injection sites at the anterior abdomen have hypertrophy of sq fat.   VITAL SIGNS:  See vs page GENERAL: no distress LAB/XRAY RESULTS: Lab Results  Component Value Date   WBC 5.1 11/07/2013   HGB 13.9 11/07/2013   HCT 41.0 11/07/2013   PLT 281.0 11/07/2013   GLUCOSE 154* 11/07/2013   CHOL 213* 11/07/2013    TRIG 184.0* 11/07/2013   HDL 50.20 11/07/2013   LDLCALC 126* 11/07/2013   ALT 19 11/07/2013   AST 18 11/07/2013   NA 141 11/07/2013   K 4.1 11/07/2013   CL 107 11/07/2013   CREATININE 1.0 11/07/2013   BUN 21 11/07/2013   CO2 27 11/07/2013   TSH 4.57* 11/07/2013   PSA 0.46 11/07/2013   INR 1.59* 09/16/2009   HGBA1C 9.2* 11/07/2013   MICROALBUR 38.6* 10/18/2012  IMPRESSION: DM: severe exacerbation Dyslipidemia: worse Noncompliance with cbg recording, f/u appts, and taking lipitor: I'll work around this as best I can PLAN:  Pt is advised to take lipitor as rx'ed.   Please reduce the lantus to 70 units each morning. Also, please increase the humalog to 15 units, with the evening meal. Please come back for a follow-up appointment in 3 months.

## 2013-11-07 NOTE — Patient Instructions (Addendum)
please consider these measures for your health:  minimize alcohol.  do not use tobacco products.  have a colonoscopy at least every 10 years from age 53.  keep firearms safely stored.  always use seat belts.  have working smoke alarms in your home.  see an eye doctor and dentist regularly.  never drive under the influence of alcohol or drugs (including prescription drugs).  those with fair skin should take precautions against the sun. Please avoid injecting in the the swollen areas of your stomach.   Please come back for a follow-up appointment in 3 months.   you will receive a phone call, about a day and time for an appointment, for a pre-colonoscopy appointment.   blood tests are being requested for you today.  We'll contact you with results.

## 2013-11-08 ENCOUNTER — Encounter: Payer: Self-pay | Admitting: Endocrinology

## 2013-11-08 LAB — CBC WITH DIFFERENTIAL/PLATELET
BASOS PCT: 0.7 % (ref 0.0–3.0)
Basophils Absolute: 0 10*3/uL (ref 0.0–0.1)
EOS ABS: 0.2 10*3/uL (ref 0.0–0.7)
EOS PCT: 4.3 % (ref 0.0–5.0)
HCT: 41 % (ref 39.0–52.0)
HEMOGLOBIN: 13.9 g/dL (ref 13.0–17.0)
LYMPHS PCT: 27.8 % (ref 12.0–46.0)
Lymphs Abs: 1.4 10*3/uL (ref 0.7–4.0)
MCHC: 33.8 g/dL (ref 30.0–36.0)
MCV: 95.5 fl (ref 78.0–100.0)
Monocytes Absolute: 0.4 10*3/uL (ref 0.1–1.0)
Monocytes Relative: 7 % (ref 3.0–12.0)
NEUTROS ABS: 3.1 10*3/uL (ref 1.4–7.7)
Neutrophils Relative %: 60.2 % (ref 43.0–77.0)
Platelets: 281 10*3/uL (ref 150.0–400.0)
RBC: 4.29 Mil/uL (ref 4.22–5.81)
RDW: 12.8 % (ref 11.5–15.5)
WBC: 5.1 10*3/uL (ref 4.0–10.5)

## 2013-11-08 LAB — LIPID PANEL
CHOL/HDL RATIO: 4
Cholesterol: 213 mg/dL — ABNORMAL HIGH (ref 0–200)
HDL: 50.2 mg/dL (ref >39.00)
LDL CALC: 126 mg/dL — AB (ref 0–99)
NONHDL: 162.8
TRIGLYCERIDES: 184 mg/dL — AB (ref 0.0–149.0)
VLDL: 36.8 mg/dL (ref 0.0–40.0)

## 2013-11-08 LAB — BASIC METABOLIC PANEL
BUN: 21 mg/dL (ref 6–23)
CALCIUM: 9.4 mg/dL (ref 8.4–10.5)
CHLORIDE: 107 meq/L (ref 96–112)
CO2: 27 mEq/L (ref 19–32)
Creatinine, Ser: 1 mg/dL (ref 0.4–1.5)
GFR: 86.31 mL/min (ref >60.00)
Glucose, Bld: 154 mg/dL — ABNORMAL HIGH (ref 70–99)
Potassium: 4.1 mEq/L (ref 3.5–5.1)
Sodium: 141 mEq/L (ref 135–145)

## 2013-11-08 LAB — HEPATIC FUNCTION PANEL
ALK PHOS: 77 U/L (ref 39–117)
ALT: 19 U/L (ref 0–53)
AST: 18 U/L (ref 0–37)
Albumin: 3.7 g/dL (ref 3.5–5.2)
BILIRUBIN DIRECT: 0.2 mg/dL (ref 0.0–0.3)
Total Bilirubin: 0.6 mg/dL (ref 0.2–1.2)
Total Protein: 6.3 g/dL (ref 6.0–8.3)

## 2013-11-08 LAB — PSA: PSA: 0.46 ng/mL (ref 0.10–4.00)

## 2013-11-08 LAB — HEMOGLOBIN A1C: HEMOGLOBIN A1C: 9.2 % — AB (ref 4.6–6.5)

## 2013-11-08 LAB — TSH: TSH: 4.57 u[IU]/mL — AB (ref 0.35–4.50)

## 2013-11-09 ENCOUNTER — Other Ambulatory Visit: Payer: Self-pay | Admitting: Endocrinology

## 2013-11-09 MED ORDER — ATORVASTATIN CALCIUM 80 MG PO TABS: 80.0000 mg | ORAL_TABLET | Freq: Every day | ORAL | Status: DC

## 2013-11-11 ENCOUNTER — Encounter: Payer: Self-pay | Admitting: Gastroenterology

## 2013-12-09 ENCOUNTER — Encounter: Payer: Self-pay | Admitting: *Deleted

## 2013-12-21 ENCOUNTER — Ambulatory Visit: Payer: BC Managed Care – PPO | Admitting: Cardiology

## 2014-01-04 ENCOUNTER — Other Ambulatory Visit: Payer: Self-pay | Admitting: Endocrinology

## 2014-01-06 ENCOUNTER — Encounter: Payer: BC Managed Care – PPO | Admitting: Gastroenterology

## 2014-03-26 ENCOUNTER — Other Ambulatory Visit: Payer: Self-pay | Admitting: Endocrinology

## 2014-04-05 ENCOUNTER — Encounter: Payer: Self-pay | Admitting: Endocrinology

## 2014-04-06 ENCOUNTER — Other Ambulatory Visit: Payer: Self-pay | Admitting: Endocrinology

## 2014-04-06 MED ORDER — HYDROCODONE-ACETAMINOPHEN 10-325 MG PO TABS: 1.0000 | ORAL_TABLET | Freq: Every day | ORAL | Status: DC | PRN

## 2014-08-07 ENCOUNTER — Other Ambulatory Visit: Payer: Self-pay | Admitting: Endocrinology

## 2014-08-30 ENCOUNTER — Other Ambulatory Visit: Payer: Self-pay | Admitting: Endocrinology

## 2014-09-08 ENCOUNTER — Ambulatory Visit (INDEPENDENT_AMBULATORY_CARE_PROVIDER_SITE_OTHER): Payer: BLUE CROSS/BLUE SHIELD | Admitting: Endocrinology

## 2014-09-08 ENCOUNTER — Encounter: Payer: Self-pay | Admitting: Endocrinology

## 2014-09-08 VITALS — BP 144/80 | HR 77 | Temp 98.3°F | Wt 199.0 lb

## 2014-09-08 DIAGNOSIS — E039 Hypothyroidism, unspecified: Secondary | ICD-10-CM

## 2014-09-08 DIAGNOSIS — E1042 Type 1 diabetes mellitus with diabetic polyneuropathy: Secondary | ICD-10-CM | POA: Diagnosis not present

## 2014-09-08 DIAGNOSIS — M25551 Pain in right hip: Secondary | ICD-10-CM | POA: Diagnosis not present

## 2014-09-08 LAB — HEMOGLOBIN A1C: HEMOGLOBIN A1C: 8.3 % — AB (ref 4.6–6.5)

## 2014-09-08 MED ORDER — LANCET DEVICES MISC: 1.0000 | Freq: Four times a day (QID) | Status: AC

## 2014-09-08 MED ORDER — INSULIN GLARGINE 100 UNIT/ML SOLOSTAR PEN
60.0000 [IU] | PEN_INJECTOR | Freq: Every day | SUBCUTANEOUS | Status: DC
Start: 2014-09-08 — End: 2014-10-31

## 2014-09-08 MED ORDER — HYDROCODONE-ACETAMINOPHEN 10-325 MG PO TABS: 1.0000 | ORAL_TABLET | Freq: Every day | ORAL | Status: DC | PRN

## 2014-09-08 NOTE — Patient Instructions (Addendum)
blood tests are requested for you today.  We'll let you know about the results. Please see Erik Bird about a V-GO device.  Please have an appointment with me the same day, to drain the cyst on your head. check your blood sugar 4 times a day: before the 3 meals, and at bedtime.  also check if you have symptoms of your blood sugar being too high or too low.  please keep a record of the readings and bring it to your next appointment here.  You can write it on any piece of paper.  please call us sooner if your blood sugar goes below 70, or if you have a lot of readings over 200. Please come back for a follow-up appointment in 3 months.

## 2014-09-08 NOTE — Progress Notes (Signed)
Subjective:    Patient ID: Erik Bird, male    DOB: 08-01-1960, 54 y.o.   MRN: [id number]  HPI  Pt returns for f/u of diabetes mellitus: DM type: 1 Dx'ed: 2585 Complications: polyneuropathy, CAD, and PAD Therapy: insulin since dx DKA: only at time of dx Severe hypoglycemia: last episode of severe hypoglycemia was in 2009 Pancreatitis: never Other: he was changed to QD insulin, after poor results on multiple daily injections Interval history: He takes 60 units of lantus qd, and a widely-varying dosage of humalog (approx 6 units 3 times a day (just before each meal).  no cbg record, but states cbg's vary from 106-200.  There is no trend throughout the day.  Past Medical History  Diagnosis Date  . DM (diabetes mellitus)     on insulin.  Developed diabetes after a coxsackievirus infection.  . Ischemic cardiomyopathy     Echo (5/11) EF 30-35% with inferior and inferoseptal akinesis, posterior  hypokinesis, mild MR, mild to moderately decreased RV systolic function, dilated IVC.   Echo (6/11): EF  35-40%, inferior and inferoseptal akinesis, no significant valvular abnormalities.  Cardiac MRI (9/11): EF  42%, mildly dilated LV, mid to apical inferior and mid posterior 76-99% thickness subendocardial delayed  enhance  . CAD (coronary artery disease)     Inferior STEMI 2/77 complicated by cardiac arrest requiring CPR.  LHC showed  total occlusion proximal RCA, 60-70% proximal LAD at D1, and 60% ostial D1.  Patient had 2 overlapping  PROMUS stents to the RCA.  Marland Kitchen Hematuria 09/2009     Probably foley catheter trauma with hematuria   . Tobacco abuse   . Hyperlipidemia   . Carotid stenosis 10/2009    Carotid doppler (6/11): 0-39% bilateral ICA stenosis.    No past surgical history on file.  History   Social History  . Marital Status: Single    Spouse Name: N/A  . Number of Children: 1  . Years of Education: N/A   Occupational History  . maintenance    Social History Main Topics  .  Smoking status: Former Smoker -- 1.00 packs/day for 30 years    Types: Cigarettes    Quit date: 10/03/2009  . Smokeless tobacco: Not on file     Comment: quit smoking in 2011  . Alcohol Use: No  . Drug Use: No  . Sexual Activity: Not on file   Other Topics Concern  . Not on file   Social History Narrative    Current Outpatient Prescriptions on File Prior to Visit  Medication Sig Dispense Refill  . Ascorbic Acid (VITAMIN C) 1000 MG tablet Take 1,000 mg by mouth daily.    Marland Kitchen aspirin EC 81 MG EC tablet Take two tablets daily    . atorvastatin (LIPITOR) 80 MG tablet Take 1 tablet (80 mg total) by mouth daily. 90 tablet 3  . carvedilol (COREG) 12.5 MG tablet TAKE ONE TABLET BY MOUTH TWICE DAILY 180 tablet 1  . Cinnamon Oil OIL by Does not apply route. 1000 two times a day     . cyclobenzaprine (FLEXERIL) 5 MG tablet TAKE ONE TABLET BY MOUTH EVERY 8 HOURS AS NEEDED FOR MUSCLE SPASM 30 tablet 0  . enalapril (VASOTEC) 5 MG tablet TAKE ONE & ONE-HALF TABLETS BY MOUTH TWICE DAILY 180 tablet 2  . fish oil-omega-3 fatty acids 1000 MG capsule Take 1 capsule by mouth 2 (two) times daily.      Marland Kitchen gabapentin (NEURONTIN) 600 MG tablet Take 1  tablet (600 mg total) by mouth 3 (three) times daily. 90 tablet 11  . Garlic-Parsley 086-76 MG CAPS Take 1 capsule by mouth daily.      . Glucosamine-Chondroitin-MSM (TRIPLE FLEX) 500-400-125 MG TABS Take 1 tablet by mouth daily.    Marland Kitchen glucose blood test strip 4X a day, and lancets 250.030  Variable glucoses     . nitroGLYCERIN (NITROSTAT) 0.4 MG SL tablet Place 1 tablet (0.4 mg total) under the tongue every 5 (five) minutes as needed for chest pain. 1 SL as needed for chest pain 100 tablet 1  . spironolactone (ALDACTONE) 25 MG tablet TAKE ONE TABLET BY MOUTH EVERY DAY 90 tablet 1  . vitamin E 400 UNIT capsule 2 capsules daily     . sildenafil (VIAGRA) 100 MG tablet Take 0.5-1 tablets (50-100 mg total) by mouth daily as needed for erectile dysfunction. 20 tablet 11    . vardenafil (LEVITRA) 20 MG tablet Take 1 tablet (20 mg total) by mouth as needed for erectile dysfunction. 5 tablet 5   No current facility-administered medications on file prior to visit.    Allergies  Allergen Reactions  . Codeine     Family History  Problem Relation Age of Onset  . Heart attack Father 28  . Arthritis    . Heart disease Mother   . Diabetes      grandparent  . Hyperlipidemia    . Hypertension    . Lung cancer    . Stroke    . Heart disease Father     BP 144/80 mmHg  Pulse 77  Temp(Src) 98.3 F (36.8 C) (Oral)  Wt 199 lb (90.266 kg)  SpO2 94%  Review of Systems He seldom has hypoglycemia, and these episodes are mild.  Denies LOC and weight change.  Chronic hip pain persists    Objective:   Physical Exam VITAL SIGNS:  See vs page GENERAL: no distress Scalp: 4 cm sebaceous cyst Pulses: dorsalis pedis intact bilat.   MSK: no deformity of the feet CV: no leg edema Skin:  no ulcer on the feet.  normal color and temp on the feet. Neuro: sensation is intact to touch on the feet.      Assessment & Plan:  Hip pain, persitent: i gave pt a refill of pain medication, but he is asked to do release of info for ortho w/u DM: ongoing poor control. HTN: mild exac: Please continue the same medication for now   Patient is advised the following: Patient Instructions  blood tests are requested for you today.  We'll let you know about the results. Please see Vaughan Basta about a V-GO device.  Please have an appointment with me the same day, to drain the cyst on your head. check your blood sugar 4 times a day: before the 3 meals, and at bedtime.  also check if you have symptoms of your blood sugar being too high or too low.  please keep a record of the readings and bring it to your next appointment here.  You can write it on any piece of paper.  please call us sooner if your blood sugar goes below 70, or if you have a lot of readings over 200. Please come back for a  follow-up appointment in 3 months.

## 2014-09-09 ENCOUNTER — Telehealth: Payer: Self-pay | Admitting: Endocrinology

## 2014-09-09 NOTE — Telephone Encounter (Signed)
please call patient: We need to do a release of information for your ortho eval of your hip pain. Please let us know where the eval was done, so we can do release of info.

## 2014-09-11 LAB — TSH: TSH: 2.83 u[IU]/mL (ref 0.35–4.50)

## 2014-09-11 NOTE — Telephone Encounter (Signed)
Left voicemail requesting call back from patient to discuss information from orthopedic evaluation.

## 2014-09-12 ENCOUNTER — Telehealth: Payer: Self-pay | Admitting: Endocrinology

## 2014-09-12 NOTE — Telephone Encounter (Signed)
Patient need refill of the Novalog pen.

## 2014-09-12 NOTE — Telephone Encounter (Signed)
Rx for novolog sent.

## 2014-09-18 ENCOUNTER — Ambulatory Visit: Payer: BLUE CROSS/BLUE SHIELD | Admitting: Endocrinology

## 2014-09-18 ENCOUNTER — Encounter: Payer: BLUE CROSS/BLUE SHIELD | Attending: Endocrinology | Admitting: Nutrition

## 2014-09-18 ENCOUNTER — Ambulatory Visit (INDEPENDENT_AMBULATORY_CARE_PROVIDER_SITE_OTHER): Payer: BLUE CROSS/BLUE SHIELD | Admitting: Endocrinology

## 2014-09-18 ENCOUNTER — Encounter: Payer: Self-pay | Admitting: Endocrinology

## 2014-09-18 VITALS — BP 138/76 | HR 68 | Temp 98.1°F | Ht 69.0 in | Wt 202.0 lb

## 2014-09-18 DIAGNOSIS — Z794 Long term (current) use of insulin: Secondary | ICD-10-CM | POA: Diagnosis not present

## 2014-09-18 DIAGNOSIS — E1040 Type 1 diabetes mellitus with diabetic neuropathy, unspecified: Secondary | ICD-10-CM | POA: Insufficient documentation

## 2014-09-18 DIAGNOSIS — L723 Sebaceous cyst: Secondary | ICD-10-CM

## 2014-09-18 DIAGNOSIS — Z713 Dietary counseling and surveillance: Secondary | ICD-10-CM | POA: Insufficient documentation

## 2014-09-18 NOTE — Patient Instructions (Signed)
Read over brochure on the V-Go and call if questions. Call to schedule an appt. If you decide to try this.

## 2014-09-18 NOTE — Patient Instructions (Signed)
Please keep the area clean and dry until is closed, which usually takes about 3 days.

## 2014-09-18 NOTE — Progress Notes (Signed)
I discussed the advantages and disadvantages of V-go therapy to both the patient and his wife..  They were shown the device, how to fill it and how to wear it.  They were interested in trying this, and paperwork was filled out and faxed to determine insulin coverage.  When they hear back from [facility], and decide if they want this, they will call me to set up an appt. For training.  They had no final questions.

## 2014-09-19 NOTE — Progress Notes (Signed)
   Subjective:    Patient ID: Erik Bird, male    DOB: 05/28/1960, 54 y.o.   MRN: [id number]  HPI (procedure only)   Review of Systems     Objective:   Physical Exam  Procedure: i and d of sebaceous cyst on the crown of the head consent obtained, signed form on chart The area is first sprayed with cooling agent local: xylocaine 2%, with epinephrine prep: alcohol pad A 1 cm incision is made with #15 blade Sebaceous material is expressed no complications     Assessment & Plan:

## 2014-10-04 ENCOUNTER — Encounter: Payer: BLUE CROSS/BLUE SHIELD | Admitting: Nutrition

## 2014-10-05 ENCOUNTER — Encounter: Payer: BLUE CROSS/BLUE SHIELD | Admitting: Dietician

## 2014-10-30 ENCOUNTER — Other Ambulatory Visit: Payer: Self-pay

## 2014-10-31 ENCOUNTER — Other Ambulatory Visit: Payer: Self-pay | Admitting: Endocrinology

## 2014-12-03 ENCOUNTER — Other Ambulatory Visit: Payer: Self-pay | Admitting: Endocrinology

## 2015-01-01 ENCOUNTER — Telehealth: Payer: Self-pay

## 2015-01-01 NOTE — Telephone Encounter (Signed)
Pt can't get A1c rechecked at this moment due to insurance

## 2015-02-14 ENCOUNTER — Other Ambulatory Visit: Payer: Self-pay | Admitting: Endocrinology

## 2015-04-04 ENCOUNTER — Other Ambulatory Visit: Payer: Self-pay | Admitting: Endocrinology

## 2015-04-17 ENCOUNTER — Telehealth: Payer: Self-pay | Admitting: Endocrinology

## 2015-04-17 MED ORDER — HYDROCODONE-ACETAMINOPHEN 10-325 MG PO TABS: 1.0000 | ORAL_TABLET | Freq: Every day | ORAL | Status: DC | PRN

## 2015-04-17 NOTE — Telephone Encounter (Signed)
I contacted the pt and advised of note below. Rx placed upfront for the pt to pick up.

## 2015-04-17 NOTE — Telephone Encounter (Signed)
See note below and please advise, Thanks! 

## 2015-04-17 NOTE — Telephone Encounter (Signed)
Patient called and would like a refill on his pain medication   Please advise   Call back: [phone]  Thank you

## 2015-04-17 NOTE — Telephone Encounter (Signed)
i printed cpx is due 

## 2015-05-11 ENCOUNTER — Ambulatory Visit: Payer: BLUE CROSS/BLUE SHIELD | Admitting: Endocrinology

## 2015-05-14 ENCOUNTER — Other Ambulatory Visit: Payer: Self-pay | Admitting: Endocrinology

## 2015-06-01 ENCOUNTER — Other Ambulatory Visit: Payer: Self-pay

## 2015-06-01 ENCOUNTER — Ambulatory Visit (INDEPENDENT_AMBULATORY_CARE_PROVIDER_SITE_OTHER): Payer: 59 | Admitting: Endocrinology

## 2015-06-01 ENCOUNTER — Telehealth: Payer: Self-pay | Admitting: Endocrinology

## 2015-06-01 ENCOUNTER — Encounter: Payer: Self-pay | Admitting: Endocrinology

## 2015-06-01 VITALS — BP 132/70 | HR 71 | Temp 97.6°F | Ht 69.0 in | Wt 202.0 lb

## 2015-06-01 DIAGNOSIS — I5022 Chronic systolic (congestive) heart failure: Secondary | ICD-10-CM | POA: Diagnosis not present

## 2015-06-01 DIAGNOSIS — E1040 Type 1 diabetes mellitus with diabetic neuropathy, unspecified: Secondary | ICD-10-CM

## 2015-06-01 LAB — POCT GLYCOSYLATED HEMOGLOBIN (HGB A1C): HEMOGLOBIN A1C: 9.3

## 2015-06-01 MED ORDER — ENALAPRIL MALEATE 5 MG PO TABS: 7.5000 mg | ORAL_TABLET | Freq: Two times a day (BID) | ORAL | Status: DC

## 2015-06-01 MED ORDER — NITROGLYCERIN 0.4 MG SL SUBL: 0.4000 mg | SUBLINGUAL_TABLET | SUBLINGUAL | Status: AC | PRN

## 2015-06-01 MED ORDER — CARVEDILOL 12.5 MG PO TABS: 12.5000 mg | ORAL_TABLET | Freq: Two times a day (BID) | ORAL | Status: DC

## 2015-06-01 MED ORDER — INSULIN LISPRO 200 UNIT/ML ~~LOC~~ SOPN: 12.0000 [IU] | PEN_INJECTOR | Freq: Three times a day (TID) | SUBCUTANEOUS | Status: DC

## 2015-06-01 MED ORDER — SPIRONOLACTONE 25 MG PO TABS: 25.0000 mg | ORAL_TABLET | Freq: Every day | ORAL | Status: DC

## 2015-06-01 MED ORDER — INSULIN ASPART 100 UNIT/ML FLEXPEN
12.0000 [IU] | PEN_INJECTOR | Freq: Three times a day (TID) | SUBCUTANEOUS | Status: DC
Start: 2015-06-01 — End: 2015-06-12

## 2015-06-01 MED ORDER — INSULIN GLARGINE 100 UNIT/ML SOLOSTAR PEN: PEN_INJECTOR | SUBCUTANEOUS | Status: DC

## 2015-06-01 NOTE — Telephone Encounter (Signed)
Patients wife called stating that [facility]  Rx: Novolog   [facility]    Thank you

## 2015-06-01 NOTE — Patient Instructions (Addendum)
I have sent a message to Erik Bird, to ask about the V-GO device on your new insurance For now: Please reduce the lantus to 50 units daily, and Increase the novolog to 12 units 3 times a day (just before each meal). check your blood sugar 4 times a day: before the 3 meals, and at bedtime.  also check if you have symptoms of your blood sugar being too high or too low.  please keep a record of the readings and bring it to your next appointment here.  You can write it on any piece of paper.  please call us sooner if your blood sugar goes below 70, or if you have a lot of readings over 200. Please come back for a regular physical in 1 month.   Please go back to see the heart specialist.  you will receive a phone call, about a day and time for an appointment.

## 2015-06-01 NOTE — Telephone Encounter (Signed)
Whichever is preferred

## 2015-06-01 NOTE — Telephone Encounter (Signed)
See note below and please advise if we can switch to Humalog?

## 2015-06-01 NOTE — Telephone Encounter (Signed)
Rx sent for humalog u 200.

## 2015-06-01 NOTE — Progress Notes (Signed)
Subjective:    Patient ID: Erik Bird, male    DOB: 01-28-1961, 55 y.o.   MRN: [id number]  HPI Pt returns for f/u of diabetes mellitus: DM type: 1 Dx'ed: 1998. Complications: polyneuropathy, CAD, and PAD.   Therapy: insulin since dx. DKA: only at time of dx. Severe hypoglycemia: last episode was in 2009.  Pancreatitis: never.  Other: he was changed to QD insulin, after poor results on multiple daily injections; insurance declined V-GO.   Interval history: He takes 60 units of lantus qd, and sliding-scale humalog (approx 6-8 units 3 times a day (just before each meal).  no cbg record, but states cbg's vary from 48-200.  He says he seldom misses the insulin.  Past Medical History  Diagnosis Date  . DM (diabetes mellitus) ([place])     on insulin.  Developed diabetes after a coxsackievirus infection.  . Ischemic cardiomyopathy     Echo (5/11) EF 30-35% with inferior and inferoseptal akinesis, posterior  hypokinesis, mild MR, mild to moderately decreased RV systolic function, dilated IVC.   Echo (6/11): EF  35-40%, inferior and inferoseptal akinesis, no significant valvular abnormalities.  Cardiac MRI (9/11): EF  42%, mildly dilated LV, mid to apical inferior and mid posterior 76-99% thickness subendocardial delayed  enhance  . CAD (coronary artery disease)     Inferior STEMI Q000111Q complicated by cardiac arrest requiring CPR.  LHC showed  total occlusion proximal RCA, 60-70% proximal LAD at D1, and 60% ostial D1.  Patient had 2 overlapping  PROMUS stents to the RCA.  Marland Kitchen Hematuria 09/2009     Probably foley catheter trauma with hematuria   . Tobacco abuse   . Hyperlipidemia   . Carotid stenosis 10/2009    Carotid doppler (6/11): 0-39% bilateral ICA stenosis.    No past surgical history on file.  Social History   Social History  . Marital Status: Single    Spouse Name: N/A  . Number of Children: 1  . Years of Education: N/A   Occupational History  . maintenance    Social History  Main Topics  . Smoking status: Former Smoker -- 1.00 packs/day for 30 years    Types: Cigarettes    Quit date: 10/03/2009  . Smokeless tobacco: Not on file     Comment: quit smoking in 2011  . Alcohol Use: No  . Drug Use: No  . Sexual Activity: Not on file   Other Topics Concern  . Not on file   Social History Narrative    Current Outpatient Prescriptions on File Prior to Visit  Medication Sig Dispense Refill  . Ascorbic Acid (VITAMIN C) 1000 MG tablet Take 1,000 mg by mouth daily.    Marland Kitchen aspirin EC 81 MG EC tablet Take two tablets daily    . atorvastatin (LIPITOR) 80 MG tablet TAKE 1 TABLET EVERY DAY 90 tablet 0  . Cinnamon Oil OIL by Does not apply route. 1000 two times a day     . fish oil-omega-3 fatty acids 1000 MG capsule Take 1 capsule by mouth 2 (two) times daily. Reported on 06/01/2015    . Garlic-Parsley Q000111Q MG CAPS Take 1 capsule by mouth daily.      Marland Kitchen glucose blood test strip 4X a day, and lancets 250.030  Variable glucoses     . HYDROcodone-acetaminophen (NORCO) 10-325 MG tablet Take 1 tablet by mouth daily as needed. 100 tablet 0  . Lancet Devices MISC 1 Device by Does not apply route 4 (four) times  daily. 360 each 3  . vitamin E 400 UNIT capsule 2 capsules daily     . cyclobenzaprine (FLEXERIL) 5 MG tablet TAKE ONE TABLET BY MOUTH EVERY 8 HOURS AS NEEDED FOR MUSCLE SPASM (Patient not taking: Reported on 06/01/2015) 30 tablet 0  . gabapentin (NEURONTIN) 600 MG tablet Take 1 tablet (600 mg total) by mouth 3 (three) times daily. (Patient not taking: Reported on 06/01/2015) 90 tablet 11  . Glucosamine-Chondroitin-MSM (TRIPLE FLEX) 500-400-125 MG TABS Take 1 tablet by mouth daily. Reported on 06/01/2015    . sildenafil (VIAGRA) 100 MG tablet Take 0.5-1 tablets (50-100 mg total) by mouth daily as needed for erectile dysfunction. 20 tablet 11  . vardenafil (LEVITRA) 20 MG tablet Take 1 tablet (20 mg total) by mouth as needed for erectile dysfunction. 5 tablet 5   No current  facility-administered medications on file prior to visit.    Allergies  Allergen Reactions  . Codeine     Family History  Problem Relation Age of Onset  . Heart attack Father 45  . Arthritis    . Heart disease Mother   . Diabetes      grandparent  . Hyperlipidemia    . Hypertension    . Lung cancer    . Stroke    . Heart disease Father     BP 132/70 mmHg  Pulse 71  Temp(Src) 97.6 F (36.4 C) (Oral)  Ht 5\' 9"  (1.753 m)  Wt 202 lb (91.627 kg)  BMI 29.82 kg/m2  SpO2 97%   Review of Systems Denies LOC and SOB.      Objective:   Physical Exam VITAL SIGNS:  See vs page GENERAL: no distress Pulses: dorsalis pedis intact bilat.   MSK: no deformity of the feet CV: no leg edema Skin:  no ulcer on the feet.  normal color and temp on the feet. Neuro: sensation is intact to touch on the feet   A1c=9.3%    Assessment & Plan:  DM: worse.  therapy limited by noncompliance with f/u appts and cbg monitoring.  i'll do the best i can. Cardiomyopathy, therapy limited by noncompliance with meds and f/u meds.  I refilled meds, adn I advised cardiol f/u.  Patient is advised the following: Patient Instructions  I have sent a message to [facility], to ask about the V-GO device on your new insurance For now: Please reduce the lantus to 50 units daily, and Increase the novolog to 12 units 3 times a day (just before each meal). check your blood sugar 4 times a day: before the 3 meals, and at bedtime.  also check if you have symptoms of your blood sugar being too high or too low.  please keep a record of the readings and bring it to your next appointment here.  You can write it on any piece of paper.  please call us sooner if your blood sugar goes below 70, or if you have a lot of readings over 200. Please come back for a regular physical in 1 month.   Please go back to see the heart specialist.  you will receive a phone call, about a day and time for an appointment.

## 2015-06-07 ENCOUNTER — Telehealth: Payer: Self-pay

## 2015-06-07 MED ORDER — INSULIN LISPRO 200 UNIT/ML ~~LOC~~ SOPN: 12.0000 [IU] | PEN_INJECTOR | Freq: Three times a day (TID) | SUBCUTANEOUS | Status: DC

## 2015-06-07 NOTE — Telephone Encounter (Signed)
ok 

## 2015-06-07 NOTE — Telephone Encounter (Signed)
Rx submitted

## 2015-06-07 NOTE — Telephone Encounter (Signed)
Received notice from the pt's pharmacy, Novolog is no longer covered. Can we changed to Humalog? Thanks!

## 2015-06-11 ENCOUNTER — Telehealth: Payer: Self-pay | Admitting: Endocrinology

## 2015-06-11 MED ORDER — V-GO 20 KIT: 1.0000 | PACK | Freq: Every day | Status: DC

## 2015-06-11 NOTE — Telephone Encounter (Signed)
I contacted the pt's wife. She stated the pt had talked to [facility] in the past about this and was unable to proceed with starting the vgo because his insurance would not pay for it. Pt now has gotten the approval through the insurance and will get the first month supply for free. Can we send the rx? Thanks!

## 2015-06-11 NOTE — Telephone Encounter (Signed)
Pt's wife notified and stated she would speak with the pt and schedule the appointment with linda.

## 2015-06-11 NOTE — Telephone Encounter (Signed)
See note below and please advise. Vgo is not in the current med list. Thanks!

## 2015-06-11 NOTE — Telephone Encounter (Signed)
Pt does not use this device

## 2015-06-11 NOTE — Telephone Encounter (Signed)
i have sent a prescription to your pharmacy The next step is to see Vaughan Basta to get started. Please call and schedule an appt with her

## 2015-06-11 NOTE — Telephone Encounter (Signed)
Patient stated he would like a prescription for the Vgo pump sent to his pharmacy. [id number] [place], [place] - [address] [phone] (Phone) [phone] (Fax

## 2015-06-12 ENCOUNTER — Encounter: Payer: Self-pay | Admitting: Cardiovascular Disease

## 2015-06-12 ENCOUNTER — Telehealth: Payer: Self-pay | Admitting: Endocrinology

## 2015-06-12 ENCOUNTER — Ambulatory Visit (INDEPENDENT_AMBULATORY_CARE_PROVIDER_SITE_OTHER): Payer: 59 | Admitting: Cardiovascular Disease

## 2015-06-12 ENCOUNTER — Encounter: Payer: 59 | Attending: Endocrinology | Admitting: Nutrition

## 2015-06-12 VITALS — BP 170/84 | HR 69 | Ht 69.0 in | Wt 201.0 lb

## 2015-06-12 DIAGNOSIS — E785 Hyperlipidemia, unspecified: Secondary | ICD-10-CM | POA: Diagnosis not present

## 2015-06-12 DIAGNOSIS — E1042 Type 1 diabetes mellitus with diabetic polyneuropathy: Secondary | ICD-10-CM | POA: Insufficient documentation

## 2015-06-12 DIAGNOSIS — E1040 Type 1 diabetes mellitus with diabetic neuropathy, unspecified: Secondary | ICD-10-CM

## 2015-06-12 DIAGNOSIS — E782 Mixed hyperlipidemia: Secondary | ICD-10-CM

## 2015-06-12 DIAGNOSIS — I5022 Chronic systolic (congestive) heart failure: Secondary | ICD-10-CM | POA: Diagnosis not present

## 2015-06-12 DIAGNOSIS — I2510 Atherosclerotic heart disease of native coronary artery without angina pectoris: Secondary | ICD-10-CM

## 2015-06-12 LAB — COMPREHENSIVE METABOLIC PANEL
ALT: 41 U/L (ref 9–46)
AST: 33 U/L (ref 10–35)
Albumin: 4 g/dL (ref 3.6–5.1)
Alkaline Phosphatase: 82 U/L (ref 40–115)
BUN: 20 mg/dL (ref 7–25)
CHLORIDE: 104 mmol/L (ref 98–110)
CO2: 28 mmol/L (ref 20–31)
CREATININE: 0.97 mg/dL (ref 0.70–1.33)
Calcium: 9.1 mg/dL (ref 8.6–10.3)
GLUCOSE: 134 mg/dL — AB (ref 65–99)
POTASSIUM: 4.4 mmol/L (ref 3.5–5.3)
SODIUM: 142 mmol/L (ref 135–146)
TOTAL PROTEIN: 6.6 g/dL (ref 6.1–8.1)
Total Bilirubin: 1.1 mg/dL (ref 0.2–1.2)

## 2015-06-12 LAB — LIPID PANEL
CHOL/HDL RATIO: 2.4 ratio (ref <=5.0)
Cholesterol: 137 mg/dL (ref 125–200)
HDL: 57 mg/dL (ref >=40)
LDL CALC: 61 mg/dL (ref <130)
Triglycerides: 94 mg/dL (ref <150)
VLDL: 19 mg/dL (ref <30)

## 2015-06-12 MED ORDER — ENALAPRIL MALEATE 5 MG PO TABS
5.0000 mg | ORAL_TABLET | Freq: Two times a day (BID) | ORAL | Status: DC
Start: 2015-06-12 — End: 2015-07-02

## 2015-06-12 MED ORDER — CARVEDILOL 6.25 MG PO TABS: 6.2500 mg | ORAL_TABLET | Freq: Two times a day (BID) | ORAL | Status: DC

## 2015-06-12 NOTE — Progress Notes (Signed)
Cardiology Office Note   Date:  06/12/2015   ID:  BODIE ABERNETHY, DOB Feb 05, 1961, MRN [id number]  PCP:  Renato Shin, MD  Cardiologist:   Thayer Headings, MD  , previous patient of [facility] Complaint  Patient presents with  . Coronary Artery Disease   Problem List 1. CAD - s/p stenting, May 2011. 2. Chronic systolic CHF -  3. Diabetes Mellitus  4. Mild carotid artery disese.  5. Essential HTN    History of Present Illness: MURICE BARBAR is a 55 y.o. 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   Stays busy at work .  Does not exercise outside of work  Does tree work on the weekends.     Past Medical History  Diagnosis Date  . DM (diabetes mellitus) ([place])     on insulin.  Developed diabetes after a coxsackievirus infection.  . Ischemic cardiomyopathy     Echo (5/11) EF 30-35% with inferior and inferoseptal akinesis, posterior  hypokinesis, mild MR, mild to moderately decreased RV systolic function, dilated IVC.   Echo (6/11): EF  35-40%, inferior and inferoseptal akinesis, no significant valvular abnormalities.  Cardiac MRI (9/11): EF  42%, mildly dilated LV, mid to apical inferior and mid posterior 76-99% thickness subendocardial delayed  enhance  . CAD (coronary artery disease)     Inferior STEMI 3/61 complicated by cardiac arrest requiring CPR.  LHC showed  total occlusion proximal RCA, 60-70% proximal LAD at D1, and 60% ostial D1.  Patient had 2 overlapping  PROMUS stents to the RCA.  Marland Kitchen Hematuria 09/2009     Probably foley catheter trauma with hematuria   . Tobacco abuse   . Hyperlipidemia   . Carotid stenosis 10/2009    Carotid doppler (6/11): 0-39% bilateral ICA stenosis.    Past Surgical History  Procedure Laterality Date  . No past surgeries       Current Outpatient Prescriptions  Medication Sig Dispense Refill  . Ascorbic Acid (VITAMIN C) 1000 MG tablet Take 1,000 mg by mouth daily.    Marland Kitchen aspirin EC 81 MG EC tablet  Take two tablets daily    . atorvastatin (LIPITOR) 80 MG tablet TAKE 1 TABLET EVERY DAY 90 tablet 0  . carvedilol (COREG) 12.5 MG tablet Take 1 tablet (12.5 mg total) by mouth 2 (two) times daily. 180 tablet 1  . Cinnamon Oil OIL by Does not apply route. 1000 two times a day     . cyclobenzaprine (FLEXERIL) 5 MG tablet TAKE ONE TABLET BY MOUTH EVERY 8 HOURS AS NEEDED FOR MUSCLE SPASM 30 tablet 0  . enalapril (VASOTEC) 5 MG tablet Take 1.5 tablets (7.5 mg total) by mouth 2 (two) times daily. 180 tablet 2  . fish oil-omega-3 fatty acids 1000 MG capsule Take 1 capsule by mouth 2 (two) times daily. Reported on 06/01/2015    . gabapentin (NEURONTIN) 600 MG tablet Take 1 tablet (600 mg total) by mouth 3 (three) times daily. 90 tablet 11  . Garlic-Parsley 443-15 MG CAPS Take 1 capsule by mouth daily.      . Glucosamine-Chondroitin-MSM (TRIPLE FLEX) 500-400-125 MG TABS Take 1 tablet by mouth daily. Reported on 06/01/2015    . glucose blood test strip 4X a day, and lancets 250.030  Variable glucoses     . HYDROcodone-acetaminophen (NORCO) 10-325 MG tablet Take 1 tablet by mouth daily as needed. 100 tablet 0  . Insulin Disposable Pump (V-GO  20) KIT 1 Device by Does not apply route daily. 30 kit 11  . Insulin Glargine (LANTUS SOLOSTAR) 100 UNIT/ML Solostar Pen INJECT 50 UNITS DAILY. 10 pen 11  . Insulin Lispro (HUMALOG KWIKPEN) 200 UNIT/ML SOPN Inject 12 Units into the skin 3 (three) times daily before meals. 6 mL 2  . Lancet Devices MISC 1 Device by Does not apply route 4 (four) times daily. 360 each 3  . nitroGLYCERIN (NITROSTAT) 0.4 MG SL tablet Place 1 tablet (0.4 mg total) under the tongue every 5 (five) minutes as needed for chest pain. 1 SL as needed for chest pain 100 tablet 1  . spironolactone (ALDACTONE) 25 MG tablet Take 1 tablet (25 mg total) by mouth daily. 90 tablet 1  . vitamin E 400 UNIT capsule 2 capsules daily     . [DISCONTINUED] insulin aspart (NOVOLOG FLEXPEN) 100 UNIT/ML FlexPen Inject 12  Units into the skin 3 (three) times daily with meals. 15 mL 11   No current facility-administered medications for this visit.    Allergies:   Codeine    Social History:  The patient  reports that he quit smoking about 5 years ago. His smoking use included Cigarettes. He has a 30 pack-year smoking history. He does not have any smokeless tobacco history on file. He reports that he does not drink alcohol or use illicit drugs.   Family History:  The patient's family history includes Heart attack (age of onset: 45) in his father; Heart disease in his father and mother.    ROS:  Please see the history of present illness.    Review of Systems: Constitutional:  denies fever, chills, diaphoresis, appetite change and fatigue.  HEENT: denies photophobia, eye pain, redness, hearing loss, ear pain, congestion, sore throat, rhinorrhea, sneezing, neck pain, neck stiffness and tinnitus.  Respiratory: denies SOB, DOE, cough, chest tightness, and wheezing.  Cardiovascular: denies chest pain, palpitations and leg swelling.  Gastrointestinal: denies nausea, vomiting, abdominal pain, diarrhea, constipation, blood in stool.  Genitourinary: denies dysuria, urgency, frequency, hematuria, flank pain and difficulty urinating.  Musculoskeletal: denies  myalgias, back pain, joint swelling, arthralgias and gait problem.   Skin: denies pallor, rash and wound.  Neurological: denies dizziness, seizures, syncope, weakness, light-headedness, numbness and headaches.   Hematological: denies adenopathy, easy bruising, personal or family bleeding history.  Psychiatric/ Behavioral: denies suicidal ideation, mood changes, confusion, nervousness, sleep disturbance and agitation.       All other systems are reviewed and negative.    PHYSICAL EXAM: VS:  BP 170/84 mmHg  Pulse 69  Ht 5\' 9"  (1.753 m)  Wt 201 lb (91.173 kg)  BMI 29.67 kg/m2 , BMI Body mass index is 29.67 kg/(m^2). GEN: Well nourished, well developed, in  no acute distress HEENT: normal Neck: no JVD, carotid bruits, or masses Cardiac:RRR;   + S3 gallop . Soft systolic murmur  Respiratory:  clear to auscultation bilaterally, normal work of breathing GI: soft, nontender, nondistended, + BS MS: no deformity or atrophy Skin: warm and dry, no rash Neuro:  Strength and sensation are intact Psych: normal   EKG:  EKG is ordered today. The ekg ordered today demonstrates NSR at 69.   No ST or T wave abn.    Recent Labs: 09/08/2014: TSH 2.83    Lipid Panel    Component Value Date/Time   CHOL 213* 11/07/2013 1644   TRIG 184.0* 11/07/2013 1644   HDL 50.20 11/07/2013 1644   CHOLHDL 4 11/07/2013 1644   VLDL 36.8 11/07/2013  [place] 126* 11/07/2013 1644      Wt Readings from Last 3 Encounters:  06/12/15 201 lb (91.173 kg)  06/01/15 202 lb (91.627 kg)  09/18/14 202 lb (91.627 kg)      Other studies Reviewed: Additional studies/ records that were reviewed today include: . Review of the above records demonstrates:    ASSESSMENT AND PLAN:  1. CAD - s/p stenting, May 2011.-   He's not having any significant episodes of angina. He has stopped smoking.   2. Chronic systolic CHF -  His EF had improved with medical therapy in 2011.    He has an S3 gallop.   Hx of CHF and has not been taking his meds in a long while .   I suspect that his EF has worsened.   He has some dyspnea with extreme exertion but still manages to keep working .  Will start Enalopril 5 BID ,,   3 days later  Will start Coreg 6.25 BID He has Aldactone also but will wait and start that at a later time  . I;ll see him in 2-3 weeks.   Will get a BMP.  Anticipate starting Aldactone shortly after that.   3. Diabetes Mellitus  4. Mild carotid artery disese.  - minimal Carotid artery disease.  5. Essential HTN  - BP is elevated.   Restarting his meds    Current medicines are reviewed at length with the patient today.  The patient does not have concerns regarding  medicines.  The following changes have been made:  no change  Labs/ tests ordered today include:  No orders of the defined types were placed in this encounter.     Disposition:   FU with me in 2-3 weeks.      Kimya Mccahill, Wonda Cheng, MD  06/12/2015 4:17 PM    [facility] [place], [place], [place]  [address] Phone: [phone]; Fax: [phone]   [facility]  [address] [place], [place]  [address] [phone]   Fax [phone]

## 2015-06-12 NOTE — Telephone Encounter (Signed)
I contacted the pt's wife and advised we would send the rx for the insulin after the training with linda has taken place. Pt's wife verbalized understanding.

## 2015-06-12 NOTE — Patient Instructions (Signed)
Fill and apply a new V-Go every morning. Stop all Lantus insulin. Give 3 clicks before all meals, and 1 click for a snack. Test blood sugars before meals and at bedtime.  Call results to [facility] on Friday

## 2015-06-12 NOTE — Patient Instructions (Addendum)
Medication Instructions:  1. Start Enalopril 5 mg twice a day  2. After 3 days, start Coreg 6. 25 mg twice a day . ( 1/2 tablet twice a day )  Will start the Aldactone  after he sees me .    Labwork: TODAY - cholesterol, complete metabolic panel  Your physician recommends that you return for lab work in: 3 weeks on same day as appointment with Dr. Acie Fredrickson   Testing/Procedures: Your physician has requested that you have an echocardiogram. Echocardiography is a painless test that uses sound waves to create images of your heart. It provides your doctor with information about the size and shape of your heart and how well your heart's chambers and valves are working. This procedure takes approximately one hour. There are no restrictions for this procedure.   Follow-Up: Your physician recommends that you schedule a follow-up appointment in: 2-3 weeks with Dr. Acie Fredrickson after echocardiogram   If you need a refill on your cardiac medications before your next appointment, please call your pharmacy.   Thank you for choosing [facility]! Christen Bame, RN [phone]

## 2015-06-12 NOTE — Progress Notes (Signed)
Patient and his wife were instructed on how to fill, apply and use the V-go.  Erik Bird filled the V-go using Humalog insulin, following the directions that were in the starter kit.  He reports that he takes 6u ac meals, so he was instructed to give 3 button presses.  He re demonstrated this successfully using a demo.   Because he took his Lantus this AM at [facility], he will not attach the filled V-Go until tomorrow morning at [facility].   He was reminded to stop the Lantus insulin today.  He re verbalized this and reported good understanding of this.   He was told to test his blood sugars before meals and at bedtime, and to call the readings to the office on Friday.  He was given a sheet to record the readings. His wife agreed to do this. We reviewed the instructions for filling the V-Go, and the various sites he can put the V-go, as well as how to give the meal time insulin, and how/when to remove it.    They had no final questions.

## 2015-06-12 NOTE — Telephone Encounter (Signed)
Pt needs rx for insulin to use with vgo, she has an appt today for the training with Vaughan Basta, she is at the pharmacy, [place], [phone] now.

## 2015-06-15 ENCOUNTER — Telehealth: Payer: Self-pay | Admitting: Endocrinology

## 2015-06-15 NOTE — Telephone Encounter (Signed)
Pt's wife advised of note below and she voiced understanding. She will advise the pt.

## 2015-06-15 NOTE — Telephone Encounter (Signed)
please call patient: Please continue the same pump settings i'll see you soon.

## 2015-06-15 NOTE — Telephone Encounter (Signed)
BS reading: 2/8 6 am 148; 1130 am 88; 6:30 pm 180; 9 pm 98 2/9 6 am 108; 1130 159; 6:30 pm 83; 9 pm 156 2/10 6 am 157; 1130 157  Please advise if any changes need to occur

## 2015-06-15 NOTE — Telephone Encounter (Signed)
See note below and please advise, Thanks! 

## 2015-06-18 ENCOUNTER — Telehealth: Payer: Self-pay | Admitting: Endocrinology

## 2015-06-18 MED ORDER — INSULIN NPH (HUMAN) (ISOPHANE) 100 UNIT/ML ~~LOC~~ SUSP: SUBCUTANEOUS | Status: DC

## 2015-06-18 NOTE — Telephone Encounter (Signed)
Pt scheduled 06/19/2015 at 315.

## 2015-06-18 NOTE — Telephone Encounter (Signed)
Please move up f/u appt to this week

## 2015-06-18 NOTE — Telephone Encounter (Addendum)
I contacted the pt's wife. Pt stated he was advised to take up to 12 clicks depending on his blood sugar.

## 2015-06-18 NOTE — Telephone Encounter (Signed)
Pt vgo is running out of insulin and his BS is spiking very high, please advise

## 2015-06-18 NOTE — Telephone Encounter (Signed)
Please verify pt is taking no clicks Then start 1 click with each meal i'll see you soon

## 2015-06-18 NOTE — Addendum Note (Signed)
Addended by: Verlin Grills T on: 06/18/2015 04:29 PM   Modules accepted: Orders

## 2015-06-18 NOTE — Telephone Encounter (Signed)
Pt's wife called to report blood sugar readings.  06/17/2015: Fasting: 88 11:00 am: 138 Mid day: 143 Evening: 186  06/17/2014:  Fasting 360  Pt's wife is concerned the pt is not getting enough insulin in the evening. Before the pt goes to bed he only has 4 units of insulin left in the V-go 20.  Please advise Thanks!

## 2015-06-19 ENCOUNTER — Encounter: Payer: Self-pay | Admitting: Endocrinology

## 2015-06-19 ENCOUNTER — Ambulatory Visit (INDEPENDENT_AMBULATORY_CARE_PROVIDER_SITE_OTHER): Payer: 59 | Admitting: Endocrinology

## 2015-06-19 VITALS — BP 148/87 | HR 63 | Temp 98.1°F | Ht 69.0 in | Wt 200.0 lb

## 2015-06-19 DIAGNOSIS — E1040 Type 1 diabetes mellitus with diabetic neuropathy, unspecified: Secondary | ICD-10-CM | POA: Diagnosis not present

## 2015-06-19 LAB — POCT GLYCOSYLATED HEMOGLOBIN (HGB A1C): HEMOGLOBIN A1C: 5.7

## 2015-06-19 MED ORDER — INSULIN ASPART 100 UNIT/ML ~~LOC~~ SOLN: SUBCUTANEOUS | Status: DC

## 2015-06-19 NOTE — Progress Notes (Signed)
Subjective:    Patient ID: Erik Bird, male    DOB: May 16, 1960, 55 y.o.   MRN: [id number]  HPI Pt returns for f/u of diabetes mellitus: DM type: 1 Dx'ed: 1998. Complications: polyneuropathy, CAD, and PAD.   Therapy: insulin since dx. DKA: only at time of dx. Severe hypoglycemia: last episode was in 2009.  Pancreatitis: never.  Other: he was changed to QD insulin, after poor results on multiple daily injections; insurance declined V-GO.   Interval history: He recently started a V-GO-20.  He has been taking 123456 clicks total per day.  no cbg record, but states cbg's vary from 118-240.  pt states he feels well in general. Past Medical History  Diagnosis Date  . DM (diabetes mellitus) ([place])     on insulin.  Developed diabetes after a coxsackievirus infection.  . Ischemic cardiomyopathy     Echo (5/11) EF 30-35% with inferior and inferoseptal akinesis, posterior  hypokinesis, mild MR, mild to moderately decreased RV systolic function, dilated IVC.   Echo (6/11): EF  35-40%, inferior and inferoseptal akinesis, no significant valvular abnormalities.  Cardiac MRI (9/11): EF  42%, mildly dilated LV, mid to apical inferior and mid posterior 76-99% thickness subendocardial delayed  enhance  . CAD (coronary artery disease)     Inferior STEMI [id number] complicated by cardiac arrest requiring CPR.  LHC showed  total occlusion proximal RCA, 60-70% proximal LAD at D1, and 60% ostial D1.  Patient had 2 overlapping  PROMUS stents to the RCA.  Marland Kitchen Hematuria 09/2009     Probably foley catheter trauma with hematuria   . Tobacco abuse   . Hyperlipidemia   . Carotid stenosis 10/2009    Carotid doppler (6/11): 0-39% bilateral ICA stenosis.    Past Surgical History  Procedure Laterality Date  . No past surgeries      Social History   Social History  . Marital Status: Single    Spouse Name: N/A  . Number of Children: 1  . Years of Education: N/A   Occupational History  . maintenance    Social  History Main Topics  . Smoking status: Former Smoker -- 1.00 packs/day for 30 years    Types: Cigarettes    Quit date: 10/03/2009  . Smokeless tobacco: Not on file     Comment: quit smoking in 2011  . Alcohol Use: No  . Drug Use: No  . Sexual Activity: Not on file   Other Topics Concern  . Not on file   Social History Narrative    Current Outpatient Prescriptions on File Prior to Visit  Medication Sig Dispense Refill  . Ascorbic Acid (VITAMIN C) 1000 MG tablet Take 1,000 mg by mouth daily.    Marland Kitchen aspirin EC 81 MG EC tablet Take two tablets daily    . carvedilol (COREG) 6.25 MG tablet Take 1 tablet (6.25 mg total) by mouth 2 (two) times daily. 60 tablet 11  . Cinnamon Oil OIL by Does not apply route. 1000 two times a day     . cyclobenzaprine (FLEXERIL) 5 MG tablet TAKE ONE TABLET BY MOUTH EVERY 8 HOURS AS NEEDED FOR MUSCLE SPASM 30 tablet 0  . enalapril (VASOTEC) 5 MG tablet Take 1 tablet (5 mg total) by mouth 2 (two) times daily. 180 tablet 2  . fish oil-omega-3 fatty acids 1000 MG capsule Take 1 capsule by mouth 2 (two) times daily. Reported on 06/01/2015    . gabapentin (NEURONTIN) 600 MG tablet Take 1 tablet (600 mg  total) by mouth 3 (three) times daily. 90 tablet 11  . Garlic-Parsley [id number] MG CAPS Take 1 capsule by mouth daily.      . Glucosamine-Chondroitin-MSM (TRIPLE FLEX) 500-400-125 MG TABS Take 1 tablet by mouth daily. Reported on 06/01/2015    . glucose blood test strip 4X a day, and lancets 250.030  Variable glucoses     . HYDROcodone-acetaminophen (NORCO) 10-325 MG tablet Take 1 tablet by mouth daily as needed. 100 tablet 0  . Lancet Devices MISC 1 Device by Does not apply route 4 (four) times daily. 360 each 3  . nitroGLYCERIN (NITROSTAT) 0.4 MG SL tablet Place 1 tablet (0.4 mg total) under the tongue every 5 (five) minutes as needed for chest pain. 1 SL as needed for chest pain 100 tablet 1  . spironolactone (ALDACTONE) 25 MG tablet Take 1 tablet (25 mg total) by mouth  daily. 90 tablet 1  . vitamin E 400 UNIT capsule 2 capsules daily      No current facility-administered medications on file prior to visit.    Allergies  Allergen Reactions  . Codeine     Family History  Problem Relation Age of Onset  . Heart attack Father 59  . Arthritis    . Heart disease Mother   . Diabetes      grandparent  . Hyperlipidemia    . Hypertension    . Lung cancer    . Stroke    . Heart disease Father     BP 148/87 mmHg  Pulse 63  Temp(Src) 98.1 F (36.7 C) (Oral)  Ht 5\' 9"  (1.753 m)  Wt 200 lb (90.719 kg)  BMI 29.52 kg/m2  SpO2 94%  Review of Systems He denies hypoglycemia.      Objective:   Physical Exam VITAL SIGNS:  See vs page GENERAL: no distress. SKIN:  Insulin infusion sites at the anterior abdomen are normal     Assessment & Plan:  DM: he needs increased rx  Patient is advised the following: Patient Instructions  Please increase to a V-GO-30.  This gives 30 units of basal insulin, spread over a day.  i have sent a prescription to your pharmacy, for the novolog. Take 3-4 clicks per meal.  Please come back for a follow-up appointment next week.   check your blood sugar 4 times a day.  vary the time of day when you check, between before the 3 meals, and at bedtime.  also check if you have symptoms of your blood sugar being too high or too low.  please keep a record of the readings and bring it to your next appointment here (or you can bring the meter itself).  You can write it on any piece of paper.  please call us sooner if your blood sugar goes below 70, or if you have a lot of readings over 200.

## 2015-06-19 NOTE — Patient Instructions (Addendum)
Please increase to a V-GO-30.  This gives 30 units of basal insulin, spread over a day.  i have sent a prescription to your pharmacy, for the novolog. Take 3-4 clicks per meal.  Please come back for a follow-up appointment next week.   check your blood sugar 4 times a day.  vary the time of day when you check, between before the 3 meals, and at bedtime.  also check if you have symptoms of your blood sugar being too high or too low.  please keep a record of the readings and bring it to your next appointment here (or you can bring the meter itself).  You can write it on any piece of paper.  please call us sooner if your blood sugar goes below 70, or if you have a lot of readings over 200.

## 2015-06-20 ENCOUNTER — Other Ambulatory Visit: Payer: Self-pay | Admitting: Endocrinology

## 2015-06-20 ENCOUNTER — Telehealth: Payer: Self-pay | Admitting: Endocrinology

## 2015-06-20 MED ORDER — INSULIN LISPRO 100 UNIT/ML ~~LOC~~ SOLN: SUBCUTANEOUS | Status: DC

## 2015-06-20 NOTE — Telephone Encounter (Signed)
Pt wife called and said the Novolog is needing an auth through the pharmacy.

## 2015-06-20 NOTE — Telephone Encounter (Signed)
See note below. I contacted the pt's wife. She stated the pt's medication would not be covered under his insurance until his meets his deductible which is 6,000$. The pt stated he does not think the V-go is a good idea at this point. Please advise. Thanks!

## 2015-06-20 NOTE — Telephone Encounter (Signed)
Patients wife would like for Megan to please call her regarding the medication being $700.00   Please advise   Thank you

## 2015-06-20 NOTE — Telephone Encounter (Signed)
I contacted the pt's wife and advised we have changed the medication to Humalog. She voiced understanding.

## 2015-06-20 NOTE — Telephone Encounter (Signed)
Ok, please go back to: lantus, 50 units daily, and novolog, 12 units 3 times a day (just before each meal).

## 2015-06-21 MED ORDER — INSULIN NPH (HUMAN) (ISOPHANE) 100 UNIT/ML ~~LOC~~ SUSP: 50.0000 [IU] | Freq: Every day | SUBCUTANEOUS | Status: DC

## 2015-06-21 MED ORDER — INSULIN REGULAR HUMAN 100 UNIT/ML IJ SOLN
INTRAMUSCULAR | Status: DC
Start: 2015-06-21 — End: 2015-09-07

## 2015-06-21 MED ORDER — "INSULIN SYRINGE 31G X 5/16"" 0.5 ML MISC": Status: DC

## 2015-06-21 NOTE — Telephone Encounter (Signed)
Pt can not go back on the Novolog because it is no longer covered under his insurance and humalog is too expensive because the pt has not met his deductible yet.

## 2015-06-21 NOTE — Telephone Encounter (Signed)
Pt's wife notified rx for Novolin N & R has been sent.

## 2015-06-21 NOTE — Telephone Encounter (Signed)
The lantus is going to be too much as well. Can we switch to Novolin N at the same dosage?

## 2015-06-21 NOTE — Telephone Encounter (Signed)
Instead of the novolog, buy reg insulin at [facility].  Same dosage.

## 2015-06-21 NOTE — Telephone Encounter (Signed)
Ok.  Same dosage 

## 2015-06-22 ENCOUNTER — Encounter: Payer: Self-pay | Admitting: Endocrinology

## 2015-06-25 ENCOUNTER — Ambulatory Visit (HOSPITAL_COMMUNITY): Payer: 59 | Attending: Cardiovascular Disease

## 2015-06-25 ENCOUNTER — Other Ambulatory Visit: Payer: Self-pay

## 2015-06-25 ENCOUNTER — Telehealth: Payer: Self-pay | Admitting: Endocrinology

## 2015-06-25 DIAGNOSIS — I5022 Chronic systolic (congestive) heart failure: Secondary | ICD-10-CM | POA: Insufficient documentation

## 2015-06-25 DIAGNOSIS — I2510 Atherosclerotic heart disease of native coronary artery without angina pectoris: Secondary | ICD-10-CM

## 2015-06-25 DIAGNOSIS — R5383 Other fatigue: Secondary | ICD-10-CM | POA: Insufficient documentation

## 2015-06-25 DIAGNOSIS — R931 Abnormal findings on diagnostic imaging of heart and coronary circulation: Secondary | ICD-10-CM | POA: Diagnosis not present

## 2015-06-25 DIAGNOSIS — E119 Type 2 diabetes mellitus without complications: Secondary | ICD-10-CM | POA: Diagnosis not present

## 2015-06-25 NOTE — Telephone Encounter (Signed)
See note and please advise °

## 2015-06-25 NOTE — Telephone Encounter (Signed)
You can wait another week.

## 2015-06-25 NOTE — Telephone Encounter (Signed)
Pt said they need the VGo 30 called into [facility] [address] and also wanted to know if they need to keep the appt for tomorrow or cancel?

## 2015-06-26 ENCOUNTER — Ambulatory Visit: Payer: 59 | Admitting: Endocrinology

## 2015-06-26 MED ORDER — V-GO 30 KIT: PACK | Status: DC

## 2015-06-26 NOTE — Telephone Encounter (Signed)
Rx submitted per pt's request. And notified of note below. Pt voiced understanding.

## 2015-06-26 NOTE — Telephone Encounter (Signed)
Pt wife was calling to see if the vgo stuff was called in and i let her know it was

## 2015-06-26 NOTE — Telephone Encounter (Signed)
I contacted the pt. Pt has scheduled his appointment for 07/13/2015. This appointment is for his yearly physical. Pt would like to wait until this appointment to follow up. Pt also wanted you know to know he had an electrocardiogram done yesterday and he would like for you to review and advise him on what you see.

## 2015-06-26 NOTE — Telephone Encounter (Signed)
Ok to wait until next month. i reviewed ecg and cardiol visit.  Looks like you are doing fine

## 2015-06-27 NOTE — Telephone Encounter (Signed)
See below and please advise if we are going to proceed with the V-go 30?

## 2015-06-27 NOTE — Telephone Encounter (Signed)
Pharmacy calling to get PA on vgo please # [phone]

## 2015-06-29 ENCOUNTER — Ambulatory Visit: Payer: 59 | Admitting: Endocrinology

## 2015-07-02 ENCOUNTER — Telehealth: Payer: Self-pay | Admitting: Nurse Practitioner

## 2015-07-02 MED ORDER — LOSARTAN POTASSIUM 50 MG PO TABS: 50.0000 mg | ORAL_TABLET | Freq: Every day | ORAL | Status: DC

## 2015-07-02 NOTE — Telephone Encounter (Signed)
Spoke with patient and wife to review echo results and plan of care.  Wife states Dr. Acie Fredrickson advised patient not to restart enalapril until echo was reviewed. I spoke with Dr. Acie Fredrickson who is in the office and he advised that he would like the patient to d/c enalapril and start losartan 50 mg once daily. He is scheduled for follow-up on 3/6.  I advised them that we will get a bmet at that office visit and then prescribe plan of care from that point.  Patient and wife verbalized understanding and agreement and thanked me for the call.

## 2015-07-02 NOTE — Telephone Encounter (Signed)
-----   Message from Thayer Headings, MD sent at 06/26/2015  4:06 PM EST ----- LV function is mildly depressed.   This is c/w with his previous hx. Continue current meds

## 2015-07-09 ENCOUNTER — Other Ambulatory Visit: Payer: 59

## 2015-07-09 ENCOUNTER — Ambulatory Visit: Payer: 59 | Admitting: Cardiovascular Disease

## 2015-07-13 ENCOUNTER — Ambulatory Visit (INDEPENDENT_AMBULATORY_CARE_PROVIDER_SITE_OTHER): Payer: 59 | Admitting: Endocrinology

## 2015-07-13 ENCOUNTER — Encounter: Payer: Self-pay | Admitting: Gastroenterology

## 2015-07-13 VITALS — BP 136/88 | HR 69 | Temp 98.1°F | Ht 69.0 in | Wt 204.0 lb

## 2015-07-13 DIAGNOSIS — Z1211 Encounter for screening for malignant neoplasm of colon: Secondary | ICD-10-CM | POA: Diagnosis not present

## 2015-07-13 DIAGNOSIS — Z125 Encounter for screening for malignant neoplasm of prostate: Secondary | ICD-10-CM

## 2015-07-13 DIAGNOSIS — Z0000 Encounter for general adult medical examination without abnormal findings: Secondary | ICD-10-CM | POA: Diagnosis not present

## 2015-07-13 DIAGNOSIS — E1051 Type 1 diabetes mellitus with diabetic peripheral angiopathy without gangrene: Secondary | ICD-10-CM

## 2015-07-13 DIAGNOSIS — E119 Type 2 diabetes mellitus without complications: Secondary | ICD-10-CM | POA: Insufficient documentation

## 2015-07-13 LAB — PSA: PSA: 0.55 ng/mL (ref 0.10–4.00)

## 2015-07-13 LAB — TSH: TSH: 3.51 u[IU]/mL (ref 0.35–4.50)

## 2015-07-13 MED ORDER — HYDROCODONE-ACETAMINOPHEN 10-325 MG PO TABS: 1.0000 | ORAL_TABLET | Freq: Every day | ORAL | Status: DC | PRN

## 2015-07-13 MED ORDER — CYCLOBENZAPRINE HCL 5 MG PO TABS: ORAL_TABLET | ORAL | Status: AC

## 2015-07-13 NOTE — Progress Notes (Signed)
Subjective:    Patient ID: Erik Bird, male    DOB: Nov 21, 1960, 55 y.o.   MRN: [id number]  HPI Pt is here for regular wellness examination, and is feeling pretty well in general, and says chronic med probs are stable, except as noted below Past Medical History  Diagnosis Date  . DM (diabetes mellitus) ([place])     on insulin.  Developed diabetes after a coxsackievirus infection.  . Ischemic cardiomyopathy     Echo (5/11) EF 30-35% with inferior and inferoseptal akinesis, posterior  hypokinesis, mild MR, mild to moderately decreased RV systolic function, dilated IVC.   Echo (6/11): EF  35-40%, inferior and inferoseptal akinesis, no significant valvular abnormalities.  Cardiac MRI (9/11): EF  42%, mildly dilated LV, mid to apical inferior and mid posterior 76-99% thickness subendocardial delayed  enhance  . CAD (coronary artery disease)     Inferior STEMI 4/03 complicated by cardiac arrest requiring CPR.  LHC showed  total occlusion proximal RCA, 60-70% proximal LAD at D1, and 60% ostial D1.  Patient had 2 overlapping  PROMUS stents to the RCA.  Marland Kitchen Hematuria 09/2009     Probably foley catheter trauma with hematuria   . Tobacco abuse   . Hyperlipidemia   . Carotid stenosis 10/2009    Carotid doppler (6/11): 0-39% bilateral ICA stenosis.    Past Surgical History  Procedure Laterality Date  . No past surgeries      Social History   Social History  . Marital Status: Single    Spouse Name: N/A  . Number of Children: 1  . Years of Education: N/A   Occupational History  . maintenance    Social History Main Topics  . Smoking status: Former Smoker -- 1.00 packs/day for 30 years    Types: Cigarettes    Quit date: 10/03/2009  . Smokeless tobacco: Not on file     Comment: quit smoking in 2011  . Alcohol Use: No  . Drug Use: No  . Sexual Activity: Not on file   Other Topics Concern  . Not on file   Social History Narrative    Current Outpatient Prescriptions on File Prior to  Visit  Medication Sig Dispense Refill  . Ascorbic Acid (VITAMIN C) 1000 MG tablet Take 1,000 mg by mouth daily.    Marland Kitchen aspirin EC 81 MG EC tablet Take two tablets daily    . atorvastatin (LIPITOR) 80 MG tablet TAKE 1 TABLET EVERY DAY 90 tablet 0  . carvedilol (COREG) 6.25 MG tablet Take 1 tablet (6.25 mg total) by mouth 2 (two) times daily. 60 tablet 11  . Cinnamon Oil OIL by Does not apply route. 1000 two times a day     . fish oil-omega-3 fatty acids 1000 MG capsule Take 1 capsule by mouth 2 (two) times daily. Reported on 06/01/2015    . Garlic-Parsley 474-25 MG CAPS Take 1 capsule by mouth daily.      . Glucosamine-Chondroitin-MSM (TRIPLE FLEX) 500-400-125 MG TABS Take 1 tablet by mouth daily. Reported on 06/01/2015    . glucose blood test strip 4X a day, and lancets 250.030  Variable glucoses     . Insulin Disposable Pump (V-GO 30) KIT 3-4 clicks per meal. 30 kit 2  . insulin regular (NOVOLIN R RELION) 100 units/mL injection Inject 12 units 3 times per day before each meal. 20 mL 2  . Insulin Syringe-Needle U-100 (INSULIN SYRINGE .5CC/31GX5/16") 31G X 5/16" 0.5 ML MISC Use to inject insulin 4 times  per day. 150 each 2  . Lancet Devices MISC 1 Device by Does not apply route 4 (four) times daily. 360 each 3  . losartan (COZAAR) 50 MG tablet Take 1 tablet (50 mg total) by mouth daily. 30 tablet 11  . nitroGLYCERIN (NITROSTAT) 0.4 MG SL tablet Place 1 tablet (0.4 mg total) under the tongue every 5 (five) minutes as needed for chest pain. 1 SL as needed for chest pain 100 tablet 1  . spironolactone (ALDACTONE) 25 MG tablet Take 1 tablet (25 mg total) by mouth daily. 90 tablet 1  . vitamin E 400 UNIT capsule 2 capsules daily     . gabapentin (NEURONTIN) 600 MG tablet Take 1 tablet (600 mg total) by mouth 3 (three) times daily. (Patient not taking: Reported on 07/13/2015) 90 tablet 11   No current facility-administered medications on file prior to visit.    No Active Allergies  Family History    Problem Relation Age of Onset  . Heart attack Father 18  . Arthritis    . Heart disease Mother   . Diabetes      grandparent  . Hyperlipidemia    . Hypertension    . Lung cancer    . Stroke    . Heart disease Father     BP 136/88 mmHg  Pulse 69  Temp(Src) 98.1 F (36.7 C) (Oral)  Ht 5' 9" (1.753 m)  Wt 204 lb (92.534 kg)  BMI 30.11 kg/m2  SpO2 96%  Review of Systems  Constitutional: Negative for fever.  HENT: Negative for hearing loss.   Eyes: Negative for visual disturbance.  Respiratory: Negative for shortness of breath.   Cardiovascular: Negative for chest pain.  Gastrointestinal: Negative for anal bleeding.  Endocrine: Negative for cold intolerance.  Genitourinary: Negative for hematuria.  Musculoskeletal: Negative for back pain.  Skin: Negative for rash.  Allergic/Immunologic: Positive for environmental allergies.  Neurological: Negative for syncope and headaches.  Hematological: Does not bruise/bleed easily.  Psychiatric/Behavioral: Negative for dysphoric mood.       Objective:   Physical Exam VS: see vs page GEN: no distress HEAD: head: no deformity eyes: no periorbital swelling, no proptosis external nose and ears are normal mouth: no lesion seen NECK: supple, thyroid is not enlarged CHEST WALL: no deformity LUNGS: clear to auscultation BREASTS:  No gynecomastia CV: reg rate and rhythm, no murmur ABD: abdomen is soft, nontender.  no hepatosplenomegaly.  not distended.  no hernia.   RECTAL/PROSTATE: declined MUSCULOSKELETAL: muscle bulk and strength are grossly normal.  no obvious joint swelling.  gait is normal and steady PULSES: no carotid bruit NEURO:  cn 2-12 grossly intact.   readily moves all 4's.   SKIN:  Normal texture and temperature.  No rash or suspicious lesion is visible.   NODES:  None palpable at the neck PSYCH: alert, well-oriented.  Does not appear anxious nor depressed.       Assessment & Plan:  Wellness visit today, with  problems stable, except as noted.   SEPARATE EVALUATION FOLLOWS--EACH PROBLEM HERE IS NEW, NOT RESPONDING TO TREATMENT, OR POSES SIGNIFICANT RISK TO THE PATIENT'S HEALTH: HISTORY OF THE PRESENT ILLNESS: Pt returns for f/u of diabetes mellitus: DM type: 1 Dx'ed: 1998. Complications: polyneuropathy, CAD, retinopathy, and PAD.   Therapy: insulin since dx. DKA: only at time of dx. Severe hypoglycemia: last episode was in 2009.  Pancreatitis: never.  Other: he was changed to QD insulin, after poor results on multiple daily injections; insurance declined V-GO.  Interval history: He recently increased to a V-GO-30.  He has been taking 3 clicks 3 times a day (just before each meal), and approx 9 extra clicks total per day. he brings a record of his cbg's which i have reviewed today.  It varies from from 68-235, but most are in the 100's.  pt states he feels well in general.  On the mornings cbg was in the 200's, this happened because he ran out of clicks.   PAST MEDICAL HISTORY reviewed and up to date today REVIEW OF SYSTEMS: hip pain persists PHYSICAL EXAMINATION: VITAL SIGNS:  See vs page GENERAL: no distress Pulses: dorsalis pedis intact bilat.   MSK: slight deformity/prominence at the lateral aspect of the right foot (previous procedure). CV: no leg edema Skin:  no ulcer on the feet.  normal color and temp on the feet. Neuro: sensation is intact to touch on the feet. LAB/XRAY RESULTS: Fructosamine=335 IMPRESSION: DM: improved, but he may still need to increase to a V-GO-40 Chronic pain at the right hip, uncertain etiology PLAN:  Please continue the same V-GO settings for now, including 3-4 clicks per meal.  i refilled vicodin for now.  i have requested a call to pt, to ask where he had this eval in the past.

## 2015-07-13 NOTE — Patient Instructions (Addendum)
blood tests are requested for you today.  We'll let you know about the results. Based on the results, we may need to increase to a V-GO-40.  Take 3-4 clicks per meal.   Please come back for a follow-up appointment in 2 months.    check your blood sugar 4 times a day.  vary the time of day when you check, between before the 3 meals, and at bedtime.  also check if you have symptoms of your blood sugar being too high or too low.  please keep a record of the readings and bring it to your next appointment here (or you can bring the meter itself).  You can write it on any piece of paper.  please call us sooner if your blood sugar goes below 70, or if you have a lot of readings over 200.   please consider these measures for your health:  minimize alcohol.  do not use tobacco products.  have a colonoscopy at least every 10 years from age 35.  Women should have an annual mammogram from age 21.  keep firearms safely stored.  always use seat belts.  have working smoke alarms in your home.  see an eye doctor and dentist regularly.  never drive under the influence of alcohol or drugs (including prescription drugs).  those with fair skin should take precautions against the sun.

## 2015-07-14 LAB — FRUCTOSAMINE: Fructosamine: 335 umol/L — ABNORMAL HIGH (ref 0–285)

## 2015-07-15 ENCOUNTER — Telehealth: Payer: Self-pay | Admitting: Endocrinology

## 2015-07-15 NOTE — Telephone Encounter (Signed)
please call patient: Who did you see for your hip pain?

## 2015-07-16 LAB — MICROALBUMIN / CREATININE URINE RATIO
CREATININE, U: 166.5 mg/dL
Microalb Creat Ratio: 4 mg/g (ref 0.0–30.0)
Microalb, Ur: 6.6 mg/dL — ABNORMAL HIGH (ref 0.0–1.9)

## 2015-07-17 NOTE — Telephone Encounter (Signed)
Spoke to pt's wife Thayer Headings and she said pt is at work, but she could help me. Asked her who pt sees for hip pain? She said Dr. Loanne Drilling sent them to see a specialist at [facility],  Dr. Laurance Flatten. Told her thank you I will let him know.  Dr. Loanne Drilling there are notes in chart under care everywhere from visit with Dr. Laurance Flatten. Also pt saw Ortho in 2012 Percell Miller and [place].

## 2015-08-10 ENCOUNTER — Other Ambulatory Visit (INDEPENDENT_AMBULATORY_CARE_PROVIDER_SITE_OTHER): Payer: 59 | Admitting: *Deleted

## 2015-08-10 ENCOUNTER — Encounter: Payer: Self-pay | Admitting: Cardiovascular Disease

## 2015-08-10 ENCOUNTER — Ambulatory Visit (INDEPENDENT_AMBULATORY_CARE_PROVIDER_SITE_OTHER): Payer: 59 | Admitting: Cardiovascular Disease

## 2015-08-10 VITALS — BP 128/58 | HR 68 | Ht 69.0 in | Wt 206.0 lb

## 2015-08-10 DIAGNOSIS — I2510 Atherosclerotic heart disease of native coronary artery without angina pectoris: Secondary | ICD-10-CM

## 2015-08-10 DIAGNOSIS — I5022 Chronic systolic (congestive) heart failure: Secondary | ICD-10-CM

## 2015-08-10 DIAGNOSIS — Z79899 Other long term (current) drug therapy: Secondary | ICD-10-CM

## 2015-08-10 LAB — BASIC METABOLIC PANEL
BUN: 16 mg/dL (ref 7–25)
CHLORIDE: 100 mmol/L (ref 98–110)
CO2: 27 mmol/L (ref 20–31)
CREATININE: 1 mg/dL (ref 0.70–1.33)
Calcium: 8.9 mg/dL (ref 8.6–10.3)
GLUCOSE: 446 mg/dL — AB (ref 65–99)
Potassium: 4.5 mmol/L (ref 3.5–5.3)
Sodium: 136 mmol/L (ref 135–146)

## 2015-08-10 MED ORDER — LISINOPRIL 10 MG PO TABS: 10.0000 mg | ORAL_TABLET | Freq: Two times a day (BID) | ORAL | Status: AC

## 2015-08-10 NOTE — Progress Notes (Signed)
Cardiology Office Note   Date:  08/10/2015   ID:  [name], DOB 03-Nov-1960, MRN [id number]  PCP:  Renato Shin, MD  Cardiologist:   Thayer Headings, MD  , previous patient of [facility] Complaint  Patient presents with  . Follow-up   Problem List 1. CAD - s/p stenting, May 2011. 2. Chronic systolic CHF -  3. Diabetes Mellitus  4. Mild carotid artery disese.  5. Essential HTN    History of Present Illness: Erik Bird is a 55 y.o. male who presents for eval of his CAD  Works in [employer].  [employer]   Stays busy at work .  Does not exercise outside of work  Does tree work on the weekends.   August 10, 2015: Stays very busy.  Does tree work on the side.  No CP or dyspnea with tree work .   Has stopped smoking with his MI.    Past Medical History  Diagnosis Date  . DM (diabetes mellitus) ([place])     on insulin.  Developed diabetes after a coxsackievirus infection.  . Ischemic cardiomyopathy     Echo (5/11) EF 30-35% with inferior and inferoseptal akinesis, posterior  hypokinesis, mild MR, mild to moderately decreased RV systolic function, dilated IVC.   Echo (6/11): EF  35-40%, inferior and inferoseptal akinesis, no significant valvular abnormalities.  Cardiac MRI (9/11): EF  42%, mildly dilated LV, mid to apical inferior and mid posterior 76-99% thickness subendocardial delayed  enhance  . CAD (coronary artery disease)     Inferior STEMI 4/65 complicated by cardiac arrest requiring CPR.  LHC showed  total occlusion proximal RCA, 60-70% proximal LAD at D1, and 60% ostial D1.  Patient had 2 overlapping  PROMUS stents to the RCA.  Marland Kitchen Hematuria 09/2009     Probably foley catheter trauma with hematuria   . Tobacco abuse   . Hyperlipidemia   . Carotid stenosis 10/2009    Carotid doppler (6/11): 0-39% bilateral ICA stenosis.    Past Surgical History  Procedure Laterality Date  . No past surgeries       Current Outpatient Prescriptions    Medication Sig Dispense Refill  . Ascorbic Acid (VITAMIN C) 1000 MG tablet Take 1,000 mg by mouth daily.    Marland Kitchen aspirin EC 81 MG EC tablet Take 325 mg by mouth daily. Take two tablets daily    . atorvastatin (LIPITOR) 80 MG tablet TAKE 1 TABLET EVERY DAY (Patient taking differently: TAKE 1 TABLET BY MOUTH EVERY DAY) 90 tablet 0  . carvedilol (COREG) 6.25 MG tablet Take 1 tablet (6.25 mg total) by mouth 2 (two) times daily. 60 tablet 11  . Cinnamon Oil OIL by Does not apply route. 1000 two times a day     . cyclobenzaprine (FLEXERIL) 5 MG tablet TAKE ONE TABLET BY MOUTH EVERY 8 HOURS AS NEEDED FOR MUSCLE SPASM 90 tablet 5  . fish oil-omega-3 fatty acids 1000 MG capsule Take 1 capsule by mouth 2 (two) times daily. Reported on 06/01/2015    . Garlic-Parsley 035-46 MG CAPS Take 1 capsule by mouth daily.      . Glucosamine-Chondroitin-MSM (TRIPLE FLEX) 500-400-125 MG TABS Take 1 tablet by mouth daily. Reported on 06/01/2015    . glucose blood test strip 4X a day, and lancets 250.030  Variable glucoses     . HYDROcodone-acetaminophen (NORCO) 10-325 MG tablet Take 1 tablet by mouth daily as needed. 100 tablet 0  .  Insulin Disposable Pump (V-GO 30) KIT 3-4 clicks per meal. 30 kit 2  . insulin regular (NOVOLIN R RELION) 100 units/mL injection Inject 12 units 3 times per day before each meal. 20 mL 2  . Lancet Devices MISC 1 Device by Does not apply route 4 (four) times daily. 360 each 3  . losartan (COZAAR) 50 MG tablet Take 1 tablet (50 mg total) by mouth daily. 30 tablet 11  . nitroGLYCERIN (NITROSTAT) 0.4 MG SL tablet Place 1 tablet (0.4 mg total) under the tongue every 5 (five) minutes as needed for chest pain. 1 SL as needed for chest pain 100 tablet 1  . spironolactone (ALDACTONE) 25 MG tablet Take 1 tablet (25 mg total) by mouth daily. 90 tablet 1  . vitamin E 400 UNIT capsule Take 400 Units by mouth daily.      No current facility-administered medications for this visit.    Allergies:   Review of  patient's allergies indicates no active allergies.    Social History:  The patient  reports that he quit smoking about 5 years ago. His smoking use included Cigarettes. He has a 30 pack-year smoking history. He does not have any smokeless tobacco history on file. He reports that he does not drink alcohol or use illicit drugs.   Family History:  The patient's family history includes Heart attack (age of onset: 81) in his father; Heart disease in his father and mother.    ROS:  Please see the history of present illness.    Review of Systems: Constitutional:  denies fever, chills, diaphoresis, appetite change and fatigue.  HEENT: denies photophobia, eye pain, redness, hearing loss, ear pain, congestion, sore throat, rhinorrhea, sneezing, neck pain, neck stiffness and tinnitus.  Respiratory: denies SOB, DOE, cough, chest tightness, and wheezing.  Cardiovascular: denies chest pain, palpitations and leg swelling.  Gastrointestinal: denies nausea, vomiting, abdominal pain, diarrhea, constipation, blood in stool.  Genitourinary: denies dysuria, urgency, frequency, hematuria, flank pain and difficulty urinating.  Musculoskeletal: denies  myalgias, back pain, joint swelling, arthralgias and gait problem.   Skin: denies pallor, rash and wound.  Neurological: denies dizziness, seizures, syncope, weakness, light-headedness, numbness and headaches.   Hematological: denies adenopathy, easy bruising, personal or family bleeding history.  Psychiatric/ Behavioral: denies suicidal ideation, mood changes, confusion, nervousness, sleep disturbance and agitation.       All other systems are reviewed and negative.    PHYSICAL EXAM: VS:  BP 150/60 mmHg  Pulse 68  Ht '5\' 9"'$  (1.753 m)  Wt 206 lb (93.441 kg)  BMI 30.41 kg/m2 , BMI Body mass index is 30.41 kg/(m^2). GEN: Well nourished, well developed, in no acute distress HEENT: normal Neck: no JVD, carotid bruits, or masses Cardiac:RRR;    . Soft  systolic murmur  Respiratory:  clear to auscultation bilaterally, normal work of breathing GI: soft, nontender, nondistended, + BS MS: no deformity or atrophy Skin: warm and dry, no rash Neuro:  Strength and sensation are intact Psych: normal   EKG:  EKG is ordered today. The ekg ordered today demonstrates NSR at 69.   No ST or T wave abn.    Recent Labs: 06/12/2015: ALT 41; BUN 20; Creat 0.97; Potassium 4.4; Sodium 142 07/13/2015: TSH 3.51    Lipid Panel    Component Value Date/Time   CHOL 137 06/12/2015 1707   TRIG 94 06/12/2015 1707   HDL 57 06/12/2015 1707   CHOLHDL 2.4 06/12/2015 1707   VLDL 19 06/12/2015 1707   LDLCALC 61  06/12/2015 1707      Wt Readings from Last 3 Encounters:  08/10/15 206 lb (93.441 kg)  07/13/15 204 lb (92.534 kg)  06/19/15 200 lb (90.719 kg)      Other studies Reviewed: Additional studies/ records that were reviewed today include: . Review of the above records demonstrates:    ASSESSMENT AND PLAN:  1. CAD - s/p stenting, May 2011.-   He's not having any significant episodes of angina. He has stopped smoking.   2. Chronic systolic CHF -  His EF had improved with medical therapy in 2011.    He is on Losartan , Carvedilol, spironolactone. The losartan has become expensive..we will DC th Losartan start him on lisinopril 10 mg twice a day. It is on the $4 list at [facility]. Check a basic medical profile in 3 weeks.  3. Diabetes Mellitus  4. Mild carotid artery disese.  - minimal Carotid artery disease.  5. Essential HTN  - BP is elevated.   Restarting his meds    Current medicines are reviewed at length with the patient today.  The patient does not have concerns regarding medicines.  The following changes have been made:  no change  Labs/ tests ordered today include:  No orders of the defined types were placed in this encounter.     Disposition:   FU with me in 6 months    Amoy Steeves, Wonda Cheng, MD  08/10/2015 3:25 PM    [facility] [place], [place], [place]  [id number] Phone: [phone]; Fax: [phone]

## 2015-08-10 NOTE — Addendum Note (Signed)
Addended by: Eulis Foster on: 08/10/2015 02:46 PM   Modules accepted: Orders

## 2015-08-10 NOTE — Patient Instructions (Addendum)
Medication Instructions:  Your physician has recommended you make the following change in your medication:  1-STOP-Losartan 2-START Lisinopril 10 mg by mouth twice daily.  Lab work: Your physician recommends that you return for lab work in: 6 months for fasting lipid and CMET.  Your physician recommends that you return for lab work in: 3 weeks for BMET  Testing/Procedures: NONE  Follow-Up: Your physician wants you to follow-up in: 6 months with Dr. Acie Fredrickson. You will receive a reminder letter in the mail two months in advance. If you don't receive a letter, please call our office to schedule the follow-up appointment.  If you need a refill on your cardiac medications before your next appointment, please call your pharmacy.

## 2015-08-11 ENCOUNTER — Telehealth: Payer: Self-pay | Admitting: Cardiology

## 2015-08-11 NOTE — Telephone Encounter (Signed)
Lab called, glucose 446 yesterday pt not at home I left message on her cell phone.  If she has questions she will call.

## 2015-08-14 ENCOUNTER — Telehealth: Payer: Self-pay | Admitting: *Deleted

## 2015-08-14 NOTE — Telephone Encounter (Signed)
I called to s/w pt about his lab work when wife said I cold s/w her she was his wife. I advised that lab results where in and says they got a message about the glucose was 446 on lab results and she did not know why we got 446 when all the readings they got that day were not nearly that high. She asked me what could cause this. I stated that possibly if the blood was hemolyzed, though this was verified by the lab and repeated which showed no hemolysis in the blood work. Wife did state they called PCP as well about the 446 glucose and were told by PCP not sure what could have caused the number to be so high. Wife did also ask did they need to still have bmet in 3 weeks because pt has not started on the Lisinopril yet. She said pt has just a little over 2 weeks left of the Losartan and is going to finish this first before starting the Lisinopril 10. I advised her we can move the lab work out about 5 weeks from today, have pt finish the Losartan, then start Lisinopril. Wife agreeable to this plan of care and lab work moved 5/19. I advised wife that I will let Dr. Acie Fredrickson know of our conversation so that he is aware Lisinopril not started yet, bmet pushed out 5 weeks from today. We need to get a DPR on file.

## 2015-08-24 ENCOUNTER — Ambulatory Visit (AMBULATORY_SURGERY_CENTER): Payer: Self-pay

## 2015-08-24 ENCOUNTER — Telehealth: Payer: Self-pay

## 2015-08-24 VITALS — Ht 69.0 in | Wt 208.0 lb

## 2015-08-24 DIAGNOSIS — Z1211 Encounter for screening for malignant neoplasm of colon: Secondary | ICD-10-CM

## 2015-08-24 NOTE — Telephone Encounter (Signed)
Please send note to pt's MD that manages insulin pump for instructions related to pt's upcoming colonoscopy on 09/07/15.  Thank you, Mckenna Gamm//PV

## 2015-08-24 NOTE — Progress Notes (Signed)
No allergies to eggs or soy No diet meds No home oxygen No past exposure to anesthesia  Has email and internet; registered for emmi

## 2015-08-27 ENCOUNTER — Telehealth: Payer: Self-pay | Admitting: Endocrinology

## 2015-08-27 ENCOUNTER — Telehealth: Payer: Self-pay

## 2015-08-27 ENCOUNTER — Other Ambulatory Visit: Payer: Self-pay

## 2015-08-27 NOTE — Telephone Encounter (Signed)
I contacted the pt and advised of Md's instructions below. Per pt's request instructions have been type out and sent via my chart.

## 2015-08-27 NOTE — Telephone Encounter (Signed)
[name] [birth year] [id number]  Dear Dr.Ellison,    Nelida Meuse, MD has scheduled the above patient for a colonoscopy at 11:00am on 09-07-2015.  Our records show that he/she is on insulin therapy via an insulin pump.  Our colonoscopy prep protocol requires that:  the patient must be on a clear liquid diet the entire day prior to the procedure date as well as the morning of the procedure  the patient must be NPO for 3 hours prior to the procedure   the patient must consume a PEG 3350 solution to prepare for the procedure.  Please advise [place] of any adjustments that need to be made to the patient's insulin pump therapy prior to the above procedure date.    Please route or fax back this completed form to me at [phone] .  If you have any question, please call me at [phone].  Thank you for your help with this matter.  Sincerely,  Magdalene River, CMA/ Dr Wilfrid Lund

## 2015-08-27 NOTE — Telephone Encounter (Signed)
While on clear liquids, pt does not need to drink diet drinks Please continue the same [id number], but no clicks unless cbg is over 300. After procedure and eating, resume usual settings Carefully check cbg's, and call if questions.

## 2015-08-27 NOTE — Telephone Encounter (Signed)
These instructions are correct, but again please note that pt should drink non-diet drinks while on clear liquids. During the procedure, they will check your cbg, so you can continue the V-GO.

## 2015-08-27 NOTE — Telephone Encounter (Signed)
The V-GO-30 is fine. The night before, I would check cbg in the middle of the night if I was you. During the procedure, they will check your cbg

## 2015-08-27 NOTE — Telephone Encounter (Signed)
Communication has been sent to Dr. Renato Shin. Awaiting response.

## 2015-08-27 NOTE — Telephone Encounter (Addendum)
I contacted the pt's wife. She stated the pt is currently on V-go 30 not the V-go 40. She also wanted to confirm what the pt should do the 24 hours leading up to the procedure. The pt is concerned about wearing the pump during the procedure and wanted to know if he could use injections the morning of the procedure and put the v-go back on after.

## 2015-08-27 NOTE — Telephone Encounter (Addendum)
I contacted the pt and advised about v-go instructions for the up coming colonoscopy. (Information documented in a separate telephone encounter.)

## 2015-08-27 NOTE — Telephone Encounter (Signed)
Requested a call back to discuss.  

## 2015-08-27 NOTE — Telephone Encounter (Signed)
Pt's wife wanted to confirm instructions: 1. 24 hrs before the procedure use the V-go 30 with no clicks unless the CBG is over 300 and check the blood sugar in the middle of the night. 2. Should the pt wear the v-go 30 during the procedure? Pt is still concerned about the V-go being messed with during the procedure.Could he switch to injections on the day of the procedure?

## 2015-08-27 NOTE — Telephone Encounter (Signed)
Pt wife calling to get a call back about pts procedure

## 2015-08-28 NOTE — Telephone Encounter (Signed)
Patient has been given directions from Dr Cordelia Pen office about her insulin pump.

## 2015-08-31 ENCOUNTER — Other Ambulatory Visit: Payer: 59

## 2015-09-07 ENCOUNTER — Encounter: Payer: Self-pay | Admitting: Gastroenterology

## 2015-09-07 ENCOUNTER — Ambulatory Visit (AMBULATORY_SURGERY_CENTER): Payer: 59 | Admitting: Gastroenterology

## 2015-09-07 VITALS — BP 155/47 | HR 64 | Temp 98.6°F | Resp 13 | Ht 69.0 in | Wt 208.0 lb

## 2015-09-07 DIAGNOSIS — Z1211 Encounter for screening for malignant neoplasm of colon: Secondary | ICD-10-CM | POA: Diagnosis present

## 2015-09-07 LAB — GLUCOSE, CAPILLARY
GLUCOSE-CAPILLARY: 289 mg/dL — AB (ref 65–99)
Glucose-Capillary: 282 mg/dL — ABNORMAL HIGH (ref 65–99)

## 2015-09-07 MED ORDER — SODIUM CHLORIDE 0.9 % IV SOLN: 500.0000 mL | INTRAVENOUS | Status: DC

## 2015-09-07 NOTE — Op Note (Signed)
[facility] Patient Name: Erik Bird Procedure Date: 09/07/2015 10:24 AM MRN: [id number] Endoscopist: Mallie Mussel L. Loletha Carrow , MD Age: 55 Date of Birth: August 04, 1960 Gender: Male Procedure:                Colonoscopy Indications:              Screening for colorectal malignant neoplasm, This                            is the patient's first colonoscopy Medicines:                Monitored Anesthesia Care Procedure:                Pre-Anesthesia Assessment:                           - Prior to the procedure, a History and Physical                            was performed, and patient medications and                            allergies were reviewed. The patient's tolerance of                            previous anesthesia was also reviewed. The risks                            and benefits of the procedure and the sedation                            options and risks were discussed with the patient.                            All questions were answered, and informed consent                            was obtained. Prior Anticoagulants: The patient has                            taken no previous anticoagulant or antiplatelet                            agents. ASA Grade Assessment: III - A patient with                            severe systemic disease. After reviewing the risks                            and benefits, the patient was deemed in                            satisfactory condition to undergo the procedure.  After obtaining informed consent, the colonoscope                            was passed under direct vision. Throughout the                            procedure, the patient's blood pressure, pulse, and                            oxygen saturations were monitored continuously. The                            Model CF-HQ190L [id number]) scope was introduced                            through the anus and advanced to the the cecum,            identified by appendiceal orifice and ileocecal                            valve. The ileocecal valve, appendiceal orifice,                            and rectum were photographed. The quality of the                            bowel preparation was good. The colonoscopy was                            performed without difficulty. The patient tolerated                            the procedure well. The bowel preparation used was                            Miralax. Scope In: 10:31:09 AM Scope Out: 10:47:13 AM Scope Withdrawal Time: 0 hours 10 minutes 10 seconds  Total Procedure Duration: 0 hours 16 minutes 4 seconds  Findings:                 The entire examined colon appeared normal on direct                            and retroflexion views. Complications:            No immediate complications. Estimated blood loss:                            None. Estimated Blood Loss:     Estimated blood loss: none. Recommendation:           - Repeat colonoscopy in 10 years for screening                            purposes.                           -  Patient has a contact number available for                            emergencies. The signs and symptoms of potential                            delayed complications were discussed with the                            patient. Return to normal activities tomorrow.                            Written discharge instructions were provided to the                            patient.                           - Resume previous diet.                           - Continue present medications. Henry L. Loletha Carrow, MD 09/07/2015 10:54:29 AM This report has been signed electronically.

## 2015-09-07 NOTE — Progress Notes (Signed)
A/ox3 pleased with MAC, report to Sarah RN 

## 2015-09-07 NOTE — Patient Instructions (Signed)
YOU HAD AN ENDOSCOPIC PROCEDURE TODAY AT THE [facility]:   Refer to the procedure report that was given to you for any specific questions about what was found during the examination.  If the procedure report does not answer your questions, please call your gastroenterologist to clarify.  If you requested that your care partner not be given the details of your procedure findings, then the procedure report has been included in a sealed envelope for you to review at your convenience later.  YOU SHOULD EXPECT: Some feelings of bloating in the abdomen. Passage of more gas than usual.  Walking can help get rid of the air that was put into your GI tract during the procedure and reduce the bloating. If you had a lower endoscopy (such as a colonoscopy or flexible sigmoidoscopy) you may notice spotting of blood in your stool or on the toilet paper. If you underwent a bowel prep for your procedure, you may not have a normal bowel movement for a few days.  Please Note:  You might notice some irritation and congestion in your nose or some drainage.  This is from the oxygen used during your procedure.  There is no need for concern and it should clear up in a day or so.  SYMPTOMS TO REPORT IMMEDIATELY:   Following lower endoscopy (colonoscopy or flexible sigmoidoscopy):  Excessive amounts of blood in the stool  Significant tenderness or worsening of abdominal pains  Swelling of the abdomen that is new, acute  Fever of 100F or higher  For urgent or emergent issues, a gastroenterologist can be reached at any hour by calling [phone].   DIET: Your first meal following the procedure should be a small meal and then it is ok to progress to your normal diet. Heavy or fried foods are harder to digest and may make you feel nauseous or bloated.  Likewise, meals heavy in dairy and vegetables can increase bloating.  Drink plenty of fluids but you should avoid alcoholic beverages for 24  hours.  ACTIVITY:  You should plan to take it easy for the rest of today and you should NOT DRIVE or use heavy machinery until tomorrow (because of the sedation medicines used during the test).    FOLLOW UP: Our staff will call the number listed on your records the next business day following your procedure to check on you and address any questions or concerns that you may have regarding the information given to you following your procedure. If we do not reach you, we will leave a message.  However, if you are feeling well and you are not experiencing any problems, there is no need to return our call.  We will assume that you have returned to your regular daily activities without incident.  If any biopsies were taken you will be contacted by phone or by letter within the next 1-3 weeks.  Please call us at [phone] if you have not heard about the biopsies in 3 weeks.    SIGNATURES/CONFIDENTIALITY: You and/or your care partner have signed paperwork which will be entered into your electronic medical record.  These signatures attest to the fact that that the information above on your After Visit Summary has been reviewed and is understood.  Full responsibility of the confidentiality of this discharge information lies with you and/or your care-partner.  Thank you for letting us take care of your healthcare needs today. 

## 2015-09-10 ENCOUNTER — Telehealth: Payer: Self-pay | Admitting: *Deleted

## 2015-09-10 NOTE — Telephone Encounter (Signed)
Left message to call with any questions. SM

## 2015-09-14 ENCOUNTER — Ambulatory Visit: Payer: 59 | Admitting: Endocrinology

## 2015-09-17 ENCOUNTER — Other Ambulatory Visit: Payer: Self-pay | Admitting: Endocrinology

## 2015-09-21 ENCOUNTER — Other Ambulatory Visit (INDEPENDENT_AMBULATORY_CARE_PROVIDER_SITE_OTHER): Payer: 59 | Admitting: *Deleted

## 2015-09-21 DIAGNOSIS — Z79899 Other long term (current) drug therapy: Secondary | ICD-10-CM | POA: Diagnosis not present

## 2015-09-21 LAB — BASIC METABOLIC PANEL
BUN: 19 mg/dL (ref 7–25)
CO2: 29 mmol/L (ref 20–31)
Calcium: 9 mg/dL (ref 8.6–10.3)
Chloride: 105 mmol/L (ref 98–110)
Creat: 0.75 mg/dL (ref 0.70–1.33)
Glucose, Bld: 62 mg/dL — ABNORMAL LOW (ref 65–99)
POTASSIUM: 4.4 mmol/L (ref 3.5–5.3)
SODIUM: 142 mmol/L (ref 135–146)

## 2015-09-21 NOTE — Addendum Note (Signed)
Addended by: Eulis Foster on: 09/21/2015 07:58 AM   Modules accepted: Orders

## 2015-09-25 ENCOUNTER — Other Ambulatory Visit: Payer: Self-pay | Admitting: Endocrinology

## 2015-11-21 ENCOUNTER — Other Ambulatory Visit: Payer: Self-pay | Admitting: Endocrinology

## 2015-11-22 ENCOUNTER — Other Ambulatory Visit: Payer: Self-pay | Admitting: Endocrinology

## 2015-11-22 MED ORDER — HYDROCODONE-ACETAMINOPHEN 10-325 MG PO TABS: 1.0000 | ORAL_TABLET | Freq: Every day | ORAL | Status: DC | PRN

## 2015-11-22 NOTE — Telephone Encounter (Signed)
Patient's wife notified and Rx placed up front for pick up. 

## 2015-12-20 ENCOUNTER — Other Ambulatory Visit: Payer: Self-pay | Admitting: Endocrinology

## 2015-12-29 ENCOUNTER — Other Ambulatory Visit: Payer: Self-pay | Admitting: Endocrinology

## 2015-12-29 NOTE — Telephone Encounter (Signed)
Please refill x 1 Ov is due  

## 2016-02-26 ENCOUNTER — Other Ambulatory Visit: Payer: Self-pay | Admitting: Endocrinology

## 2016-02-26 NOTE — Telephone Encounter (Signed)
Please refill x 1 Ov is due  

## 2016-03-25 ENCOUNTER — Other Ambulatory Visit: Payer: Self-pay | Admitting: Endocrinology

## 2016-03-29 ENCOUNTER — Other Ambulatory Visit: Payer: Self-pay | Admitting: Endocrinology

## 2016-05-07 ENCOUNTER — Ambulatory Visit (INDEPENDENT_AMBULATORY_CARE_PROVIDER_SITE_OTHER): Payer: 59 | Admitting: Endocrinology

## 2016-05-07 ENCOUNTER — Encounter: Payer: Self-pay | Admitting: Endocrinology

## 2016-05-07 VITALS — Wt 206.0 lb

## 2016-05-07 DIAGNOSIS — G8929 Other chronic pain: Secondary | ICD-10-CM

## 2016-05-07 DIAGNOSIS — E1051 Type 1 diabetes mellitus with diabetic peripheral angiopathy without gangrene: Secondary | ICD-10-CM | POA: Diagnosis not present

## 2016-05-07 DIAGNOSIS — R1011 Right upper quadrant pain: Secondary | ICD-10-CM | POA: Diagnosis not present

## 2016-05-07 LAB — URINALYSIS, ROUTINE W REFLEX MICROSCOPIC
Bilirubin Urine: NEGATIVE
KETONES UR: 15 — AB
Nitrite: POSITIVE — AB
PH: 6.5 (ref 5.0–8.0)
Specific Gravity, Urine: 1.02 (ref 1.000–1.030)
Total Protein, Urine: 100 — AB
Urine Glucose: >=1000 — AB
Urobilinogen, UA: 1 (ref 0.0–1.0)

## 2016-05-07 LAB — HEPATIC FUNCTION PANEL
ALK PHOS: 92 U/L (ref 39–117)
ALT: 21 U/L (ref 0–53)
AST: 19 U/L (ref 0–37)
Albumin: 3.5 g/dL (ref 3.5–5.2)
Bilirubin, Direct: 0.2 mg/dL (ref 0.0–0.3)
Total Bilirubin: 1.4 mg/dL — ABNORMAL HIGH (ref 0.2–1.2)
Total Protein: 6.2 g/dL (ref 6.0–8.3)

## 2016-05-07 LAB — CBC WITH DIFFERENTIAL/PLATELET
BASOS ABS: 0 10*3/uL (ref 0.0–0.1)
Basophils Relative: 0.4 % (ref 0.0–3.0)
EOS ABS: 0.2 10*3/uL (ref 0.0–0.7)
Eosinophils Relative: 1.9 % (ref 0.0–5.0)
HCT: 39.5 % (ref 39.0–52.0)
HEMOGLOBIN: 13.6 g/dL (ref 13.0–17.0)
Lymphocytes Relative: 13.6 % (ref 12.0–46.0)
Lymphs Abs: 1.3 10*3/uL (ref 0.7–4.0)
MCHC: 34.4 g/dL (ref 30.0–36.0)
MCV: 92.2 fl (ref 78.0–100.0)
MONO ABS: 1.1 10*3/uL — AB (ref 0.1–1.0)
Monocytes Relative: 12 % (ref 3.0–12.0)
Neutro Abs: 6.9 10*3/uL (ref 1.4–7.7)
Neutrophils Relative %: 72.1 % (ref 43.0–77.0)
Platelets: 268 10*3/uL (ref 150.0–400.0)
RBC: 4.28 Mil/uL (ref 4.22–5.81)
RDW: 12.8 % (ref 11.5–15.5)
WBC: 9.5 10*3/uL (ref 4.0–10.5)

## 2016-05-07 LAB — BASIC METABOLIC PANEL
BUN: 23 mg/dL (ref 6–23)
CALCIUM: 8.9 mg/dL (ref 8.4–10.5)
CHLORIDE: 100 meq/L (ref 96–112)
CO2: 29 mEq/L (ref 19–32)
CREATININE: 1.55 mg/dL — AB (ref 0.40–1.50)
GFR: 49.78 mL/min — ABNORMAL LOW (ref >60.00)
Glucose, Bld: 298 mg/dL — ABNORMAL HIGH (ref 70–99)
Potassium: 4.3 mEq/L (ref 3.5–5.1)
Sodium: 136 mEq/L (ref 135–145)

## 2016-05-07 LAB — POCT GLYCOSYLATED HEMOGLOBIN (HGB A1C): Hemoglobin A1C: 9.8

## 2016-05-07 LAB — AMYLASE: AMYLASE: 36 U/L (ref 27–131)

## 2016-05-07 MED ORDER — CIPROFLOXACIN HCL 250 MG PO TABS: 250.0000 mg | ORAL_TABLET | Freq: Two times a day (BID) | ORAL | 0 refills | Status: DC

## 2016-05-07 MED ORDER — OMEPRAZOLE 40 MG PO CPDR
40.0000 mg | DELAYED_RELEASE_CAPSULE | Freq: Every day | ORAL | 3 refills | Status: DC
Start: 2016-05-07 — End: 2016-09-01

## 2016-05-07 MED ORDER — V-GO 40 KIT: 1.0000 | PACK | Freq: Every day | 11 refills | Status: DC

## 2016-05-07 MED ORDER — HYDROCODONE-ACETAMINOPHEN 10-325 MG PO TABS: 1.0000 | ORAL_TABLET | Freq: Every day | ORAL | 0 refills | Status: DC | PRN

## 2016-05-07 NOTE — Patient Instructions (Addendum)
please increase to a V-GO-40.  I have sent a prescription to your pharmacy.  Take 5 clicks per meal.   check your blood sugar 4 times a day: before the 3 meals, and at bedtime.  also check if you have symptoms of your blood sugar being too high or too low.  please keep a record of the readings and bring it to your next appointment here (or you can bring the meter itself).  You can write it on any piece of paper.  please call us sooner if your blood sugar goes below 70, or if you have a lot of readings over 200.  Blood and urine tests are requested for you today.  We'll let you know about the results.  Let's check an ultrasound.  you will receive a phone call, about a day and time for an appointment.   I have sent a prescription to your pharmacy, to see if it helps the symptoms.   Please come back for a follow-up appointment in 6 weeks.

## 2016-05-07 NOTE — Progress Notes (Signed)
Subjective:    Patient ID: Erik Bird, male    DOB: Aug 24, 1960, 56 y.o.   MRN: [id number]  HPI Pt returns for f/u of diabetes mellitus: DM type: 1 Dx'ed: 1998. Complications: polyneuropathy, CAD, retinopathy, and PAD.   Therapy: insulin since dx; V-GO since 2016.  DKA: only at time of dx. Severe hypoglycemia: last episode was in 2009.  Pancreatitis: never.  Other: he was changed to QD insulin, after poor results on multiple daily injections; insurance declined V-GO.   Interval history: no cbg record, but states cbg's vary from 60-366.  It is in general higher as the day goes on.  He says he takes the maximun # of clicks per day.   3 days ago, he had an episode of severe RUQ pain, and assoc vomiting.  He had a milder episode yesterday.   Past Medical History:  Diagnosis Date  . CAD (coronary artery disease)    Inferior STEMI Q000111Q complicated by cardiac arrest requiring CPR.  LHC showed  total occlusion proximal RCA, 60-70% proximal LAD at D1, and 60% ostial D1.  Patient had 2 overlapping  PROMUS stents to the RCA.  . Carotid stenosis 10/2009   Carotid doppler (6/11): 0-39% bilateral ICA stenosis.  . DM (diabetes mellitus) ([place])    on insulin.  Developed diabetes after a coxsackievirus infection.  . Hematuria 09/2009    Probably foley catheter trauma with hematuria   . Hyperlipidemia   . Ischemic cardiomyopathy    Echo (5/11) EF 30-35% with inferior and inferoseptal akinesis, posterior  hypokinesis, mild MR, mild to moderately decreased RV systolic function, dilated IVC.   Echo (6/11): EF  35-40%, inferior and inferoseptal akinesis, no significant valvular abnormalities.  Cardiac MRI (9/11): EF  42%, mildly dilated LV, mid to apical inferior and mid posterior 76-99% thickness subendocardial delayed  enhance  . Myocardial infarction 2012   w stents  . Tobacco abuse     Past Surgical History:  Procedure Laterality Date  . CARDIAC CATHETERIZATION  2012  . NO PAST SURGERIES       Social History   Social History  . Marital status: Single    Spouse name: N/A  . Number of children: 1  . Years of education: N/A   Occupational History  . maintenance    Social History Main Topics  . Smoking status: Former Smoker    Packs/day: 1.00    Years: 30.00    Types: Cigarettes    Quit date: 10/03/2009  . Smokeless tobacco: Never Used     Comment: quit smoking in 2011  . Alcohol use No  . Drug use: No  . Sexual activity: Not on file   Other Topics Concern  . Not on file   Social History Narrative  . No narrative on file    Current Outpatient Prescriptions on File Prior to Visit  Medication Sig Dispense Refill  . Ascorbic Acid (VITAMIN C) 1000 MG tablet Take 1,000 mg by mouth daily.    Marland Kitchen aspirin EC 81 MG EC tablet Take 325 mg by mouth daily. Take two tablets daily    . atorvastatin (LIPITOR) 80 MG tablet TAKE 1 TABLET EVERY DAY 90 tablet 0  . carvedilol (COREG) 6.25 MG tablet Take 1 tablet (6.25 mg total) by mouth 2 (two) times daily. 60 tablet 11  . Cinnamon Oil OIL by Does not apply route. 1000 two times a day     . cyclobenzaprine (FLEXERIL) 5 MG tablet TAKE ONE TABLET BY  MOUTH EVERY 8 HOURS AS NEEDED FOR MUSCLE SPASM 90 tablet 5  . fish oil-omega-3 fatty acids 1000 MG capsule Take 1 capsule by mouth 2 (two) times daily. Reported on 06/01/2015    . Garlic-Parsley Q000111Q MG CAPS Take 1 capsule by mouth daily.      Marland Kitchen glucose blood test strip 4X a day, and lancets 250.030  Variable glucoses     . Lancet Devices MISC 1 Device by Does not apply route 4 (four) times daily. 360 each 3  . lisinopril (PRINIVIL,ZESTRIL) 10 MG tablet Take 1 tablet (10 mg total) by mouth 2 (two) times daily. 180 tablet 3  . nitroGLYCERIN (NITROSTAT) 0.4 MG SL tablet Place 1 tablet (0.4 mg total) under the tongue every 5 (five) minutes as needed for chest pain. 1 SL as needed for chest pain 100 tablet 1  . spironolactone (ALDACTONE) 25 MG tablet TAKE 1 TABLET (25 MG TOTAL) BY MOUTH DAILY.  90 tablet 0  . vitamin E 400 UNIT capsule Take 400 Units by mouth daily.      No current facility-administered medications on file prior to visit.     No Known Allergies  Family History  Problem Relation Age of Onset  . Heart attack Father 27  . Heart disease Father   . Arthritis    . Heart disease Mother   . Diabetes      grandparent  . Hyperlipidemia    . Hypertension    . Lung cancer    . Stroke    . Colon cancer Neg Hx     Wt 206 lb (93.4 kg)   BMI 30.42 kg/m    Review of Systems Denies fever and diarrhea.      Objective:   Physical Exam VITAL SIGNS:  See vs page GENERAL: no distress ABDOMEN: abdomen is soft, nontender.  no hepatosplenomegaly.  not distended.  no hernia.   Pulses: dorsalis pedis intact bilat.   MSK: no deformity of the feet CV: no leg edema Skin:  no ulcer on the feet.  normal color and temp on the feet. Neuro: sensation is intact to touch on the feet  Lab Results  Component Value Date   HGBA1C 9.8 05/07/2016      Assessment & Plan:  Type 1 DM, worse.  RUQ pain, new, uncertain etiology. W/u needed.  Patient is advised the following: Patient Instructions  please increase to a V-GO-40.  I have sent a prescription to your pharmacy.  Take 5 clicks per meal.   check your blood sugar 4 times a day: before the 3 meals, and at bedtime.  also check if you have symptoms of your blood sugar being too high or too low.  please keep a record of the readings and bring it to your next appointment here (or you can bring the meter itself).  You can write it on any piece of paper.  please call us sooner if your blood sugar goes below 70, or if you have a lot of readings over 200.  Blood and urine tests are requested for you today.  We'll let you know about the results.  Let's check an ultrasound.  you will receive a phone call, about a day and time for an appointment.   I have sent a prescription to your pharmacy, to see if it helps the symptoms.   Please  come back for a follow-up appointment in 6 weeks.

## 2016-05-08 ENCOUNTER — Other Ambulatory Visit: Payer: 59

## 2016-05-08 DIAGNOSIS — G8929 Other chronic pain: Secondary | ICD-10-CM

## 2016-05-08 DIAGNOSIS — K6289 Other specified diseases of anus and rectum: Principal | ICD-10-CM

## 2016-05-09 ENCOUNTER — Ambulatory Visit
Admission: RE | Admit: 2016-05-09 | Discharge: 2016-05-09 | Disposition: A | Discharge status: Home / Self Care | Payer: 59 | Source: Ambulatory Visit | Attending: Endocrinology | Admitting: Endocrinology

## 2016-05-09 ENCOUNTER — Ambulatory Visit: Payer: 59 | Admitting: Endocrinology

## 2016-05-09 DIAGNOSIS — R1011 Right upper quadrant pain: Principal | ICD-10-CM

## 2016-05-09 DIAGNOSIS — G8929 Other chronic pain: Secondary | ICD-10-CM

## 2016-05-10 ENCOUNTER — Encounter: Payer: Self-pay | Admitting: Endocrinology

## 2016-05-10 LAB — URINE CULTURE

## 2016-06-30 LAB — HM DIABETES EYE EXAM

## 2016-07-01 ENCOUNTER — Other Ambulatory Visit: Payer: Self-pay | Admitting: Endocrinology

## 2016-07-01 ENCOUNTER — Encounter: Payer: Self-pay | Admitting: Endocrinology

## 2016-07-01 ENCOUNTER — Other Ambulatory Visit: Payer: Self-pay | Admitting: Cardiovascular Disease

## 2016-07-01 NOTE — Telephone Encounter (Signed)
Patient wife is calling on the status of patient vgo pump, they haven't heard anything. Please advise

## 2016-07-01 NOTE — Telephone Encounter (Signed)
I contacted the patients wife. She stated she and the patient are getting frustrated with the V-go 40. She stated in a box that contains 30 devices 2 or 3 will malfunction each time he gets a new box. She stated 1 box costs 80$ and he is not getting the full benefit of his payment. Patient's wife stated when he was using the V-go 20 and 30 he did not have these problems and wanted to know if he could go back to the 20 or 30. If the patient cannot go back to the 20 or 30 he wanted to know what his other alternatives would be?  Please advise, Thanks!

## 2016-07-02 NOTE — Telephone Encounter (Signed)
please call patient: Erik Bird has been working on this.  It may be that the V-GO-40 is unavailable.  If necessary, I'll request 2x20.   How soon do you need?

## 2016-07-03 MED ORDER — INSULIN GLARGINE 100 UNIT/ML SOLOSTAR PEN: 60.0000 [IU] | PEN_INJECTOR | SUBCUTANEOUS | 99 refills | Status: DC

## 2016-07-03 NOTE — Telephone Encounter (Signed)
I contacted the patient's wife and she voiced understanding on instructions but stated the Novolog would be too expensive for the patient. She wanted to know if he could stay on the Novolin R? Please advise, Thanks!

## 2016-07-03 NOTE — Telephone Encounter (Signed)
I contacted the patient's wife and advised of message. She stated the patient has 9 days left of the vgo 40 and sending in two of the 20's would not work. She stated 2 boxes would be 160 $ and they could not afford this. She stated it might be best for him to discontinue the vgo due to the price and the malfunctions they have had with the devices. Please advise, Thanks!

## 2016-07-03 NOTE — Telephone Encounter (Signed)
Ok, please d/c V-GO, and start: lantus, 60 units daily, and Novolog, units 5 times a day (just before each meal). Please come back for a follow-up appointment in 2 weeks.

## 2016-07-04 MED ORDER — INSULIN ASPART 100 UNIT/ML FLEXPEN: 5.0000 [IU] | PEN_INJECTOR | Freq: Three times a day (TID) | SUBCUTANEOUS | 11 refills | Status: DC

## 2016-07-08 ENCOUNTER — Other Ambulatory Visit: Payer: Self-pay | Admitting: Endocrinology

## 2016-07-08 MED ORDER — INSULIN REGULAR HUMAN 100 UNIT/ML IJ SOLN: 5.0000 [IU] | Freq: Three times a day (TID) | INTRAMUSCULAR | 11 refills | Status: DC

## 2016-07-10 ENCOUNTER — Telehealth: Payer: Self-pay | Admitting: Nutrition

## 2016-07-10 NOTE — Telephone Encounter (Signed)
I spoke with patient's wife about the problems he is having with his V-gos.  She tells me that the needle release button keeps "popping up", and they then have to waist all of the insulin and insert a new pod.   After speaking to the rep., I told her to call [facility], and she says that they have done this twice.  They have replaced the V-Gos, but they continue to have this problem.  I told her that I can only think that he is accidentally pressing the needle release button when he presses down on the insulin ready button.  She says that it does not happen when he is bolusing.   I am not sure what is happening, and told her this.  I will contact the V-go help line to see if there is anything more I can do.

## 2016-09-01 ENCOUNTER — Telehealth: Payer: Self-pay

## 2016-09-01 ENCOUNTER — Other Ambulatory Visit: Payer: Self-pay

## 2016-09-01 MED ORDER — CARVEDILOL 6.25 MG PO TABS: 6.2500 mg | ORAL_TABLET | Freq: Two times a day (BID) | ORAL | 1 refills | Status: DC

## 2016-09-01 MED ORDER — OMEPRAZOLE 40 MG PO CPDR: 40.0000 mg | DELAYED_RELEASE_CAPSULE | Freq: Every day | ORAL | 3 refills | Status: AC

## 2016-09-01 NOTE — Telephone Encounter (Signed)
Patient request refill on carvedilol. Will send 30 pills to pharmacy. 2nd attempt. Patient aware appointment required.

## 2016-09-02 ENCOUNTER — Other Ambulatory Visit: Payer: Self-pay | Admitting: Cardiovascular Disease

## 2016-10-01 ENCOUNTER — Other Ambulatory Visit: Payer: Self-pay | Admitting: Endocrinology

## 2016-10-05 ENCOUNTER — Other Ambulatory Visit: Payer: Self-pay | Admitting: Cardiovascular Disease

## 2016-10-20 ENCOUNTER — Other Ambulatory Visit: Payer: Self-pay | Admitting: Cardiovascular Disease

## 2016-10-21 ENCOUNTER — Telehealth: Payer: Self-pay | Admitting: Nutrition

## 2016-11-10 NOTE — Telephone Encounter (Signed)
Chart opened in error

## 2017-01-03 ENCOUNTER — Other Ambulatory Visit: Payer: Self-pay | Admitting: Endocrinology

## 2017-01-03 NOTE — Telephone Encounter (Signed)
Please refill x 1 cpx is due 

## 2017-01-06 ENCOUNTER — Other Ambulatory Visit: Payer: Self-pay

## 2017-01-06 MED ORDER — ATORVASTATIN CALCIUM 80 MG PO TABS: 80.0000 mg | ORAL_TABLET | Freq: Every day | ORAL | 0 refills | Status: AC

## 2017-05-27 ENCOUNTER — Other Ambulatory Visit: Payer: Self-pay | Admitting: Endocrinology

## 2017-05-28 NOTE — Telephone Encounter (Signed)
Patient wife called need refill of Vgo 40   send to  Pharmacy:  [id number] - [place], [place] #:  [id number]    Pharmacy Comments:  --   Can he get a discount card please

## 2017-05-28 NOTE — Telephone Encounter (Signed)
Ok to refill, Rx is not on current medication.

## 2017-05-29 ENCOUNTER — Other Ambulatory Visit: Payer: Self-pay

## 2017-05-29 MED ORDER — V-GO 40 KIT: PACK | 11 refills | Status: DC

## 2017-05-29 NOTE — Telephone Encounter (Signed)
Pts wife is requesting a call back to discuss the script that is suppose to be getting sent over   Please advise [phone] Thayer Headings (wife)

## 2017-07-04 IMAGING — US US ABDOMEN COMPLETE
1 series · 14 of 25 positions shown · non-contrast
Comparison: None.

CLINICAL DATA: Abdominal pain, chronic, right upper quadrant

EXAM:
ABDOMEN ULTRASOUND COMPLETE

[Series 1: us abdomen complete · 0.22mm/px · 14 of 71 slices shown]
[im 1/71]
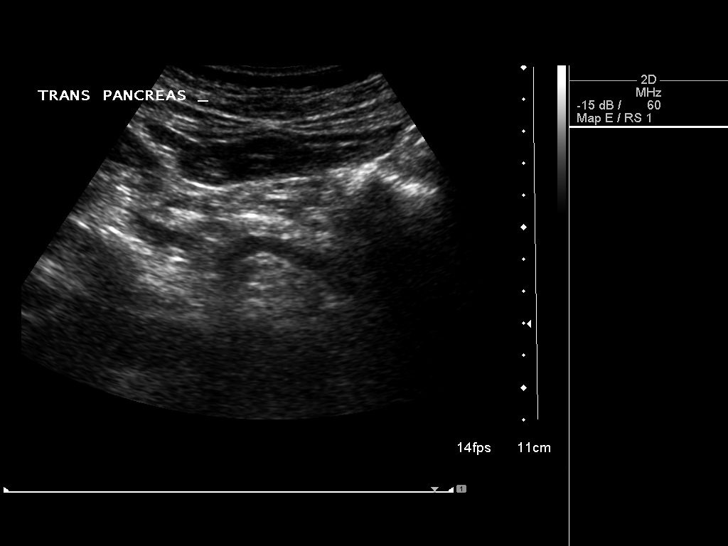
[im 6/71]
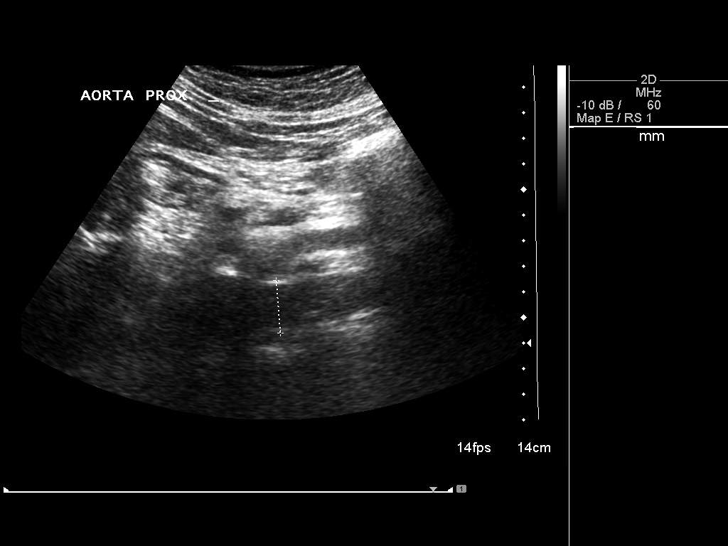
[im 12/71]
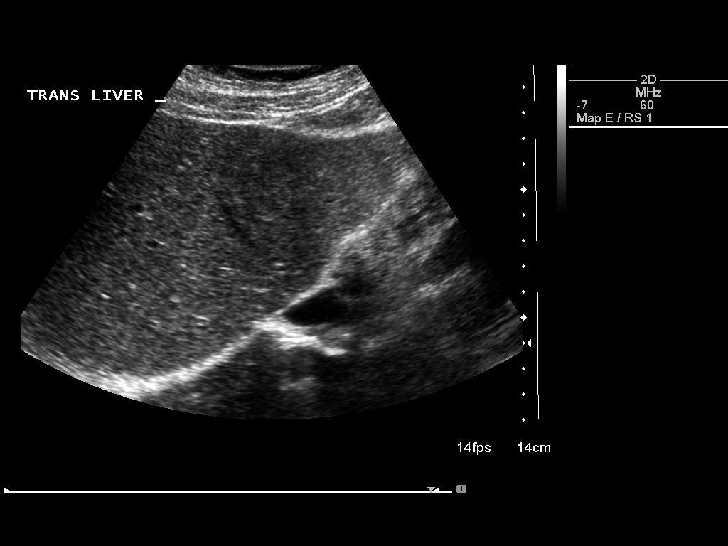
[im 18/71]
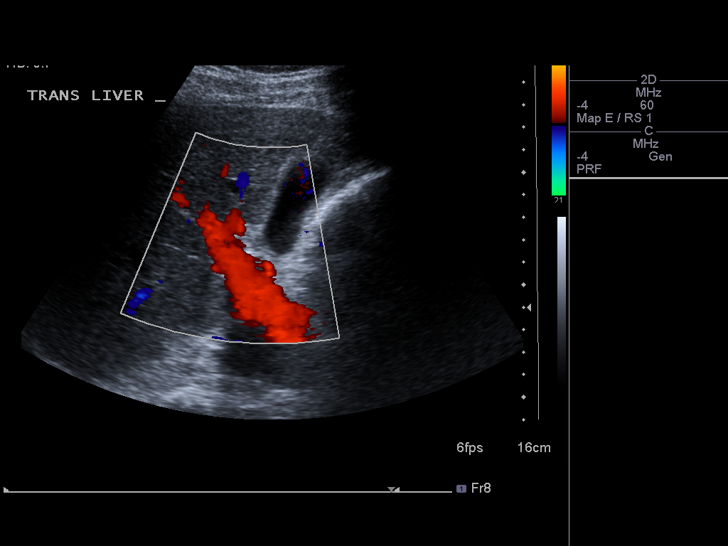
[im 24/71]
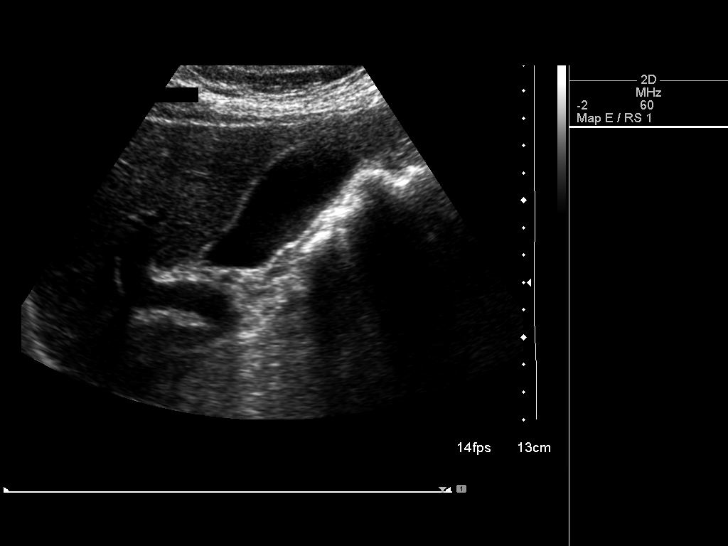
[im 27/71]
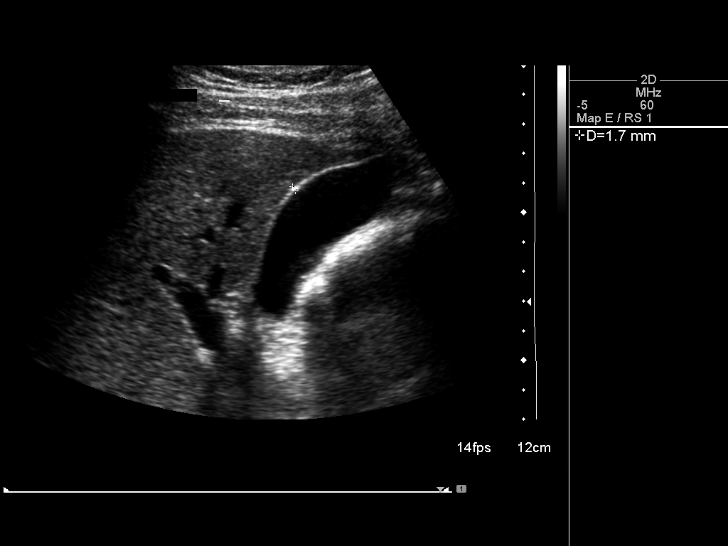
[im 33/71]
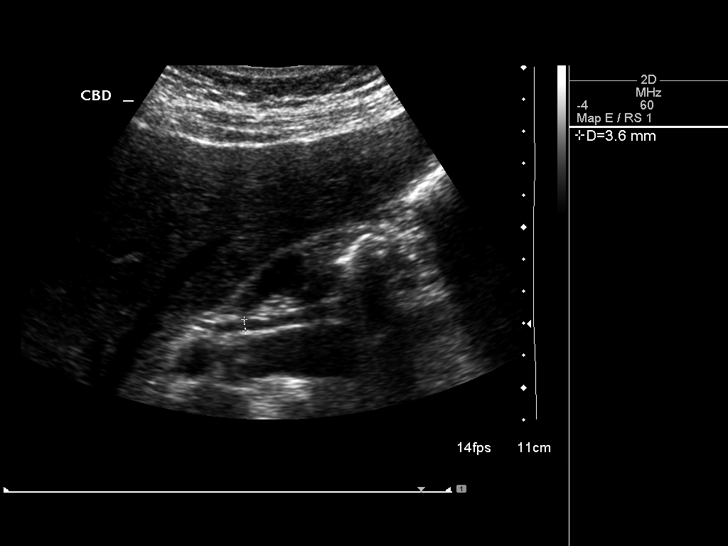
[im 38/71]
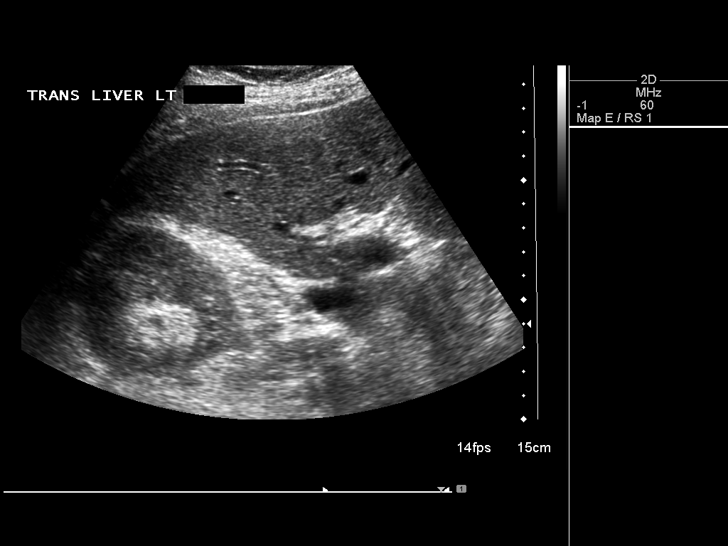
[im 44/71]
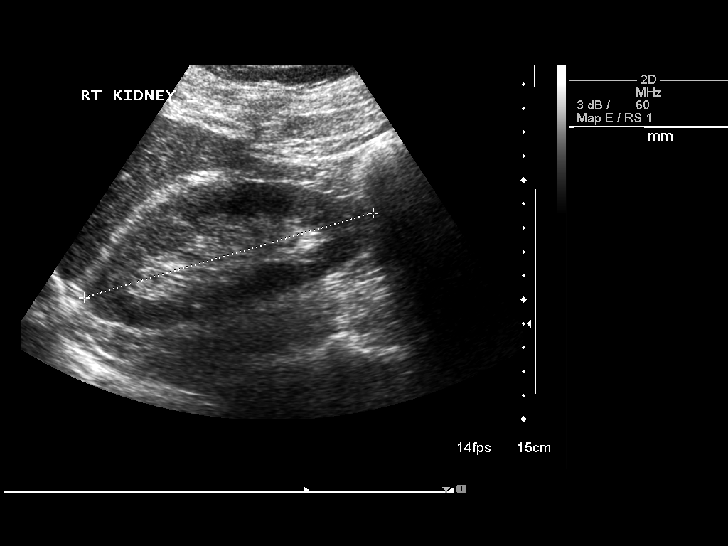
[im 47/71]
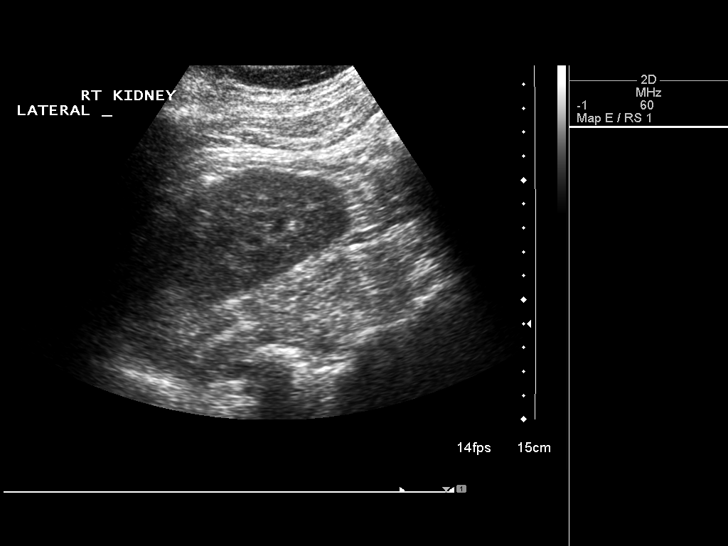
[im 53/71]
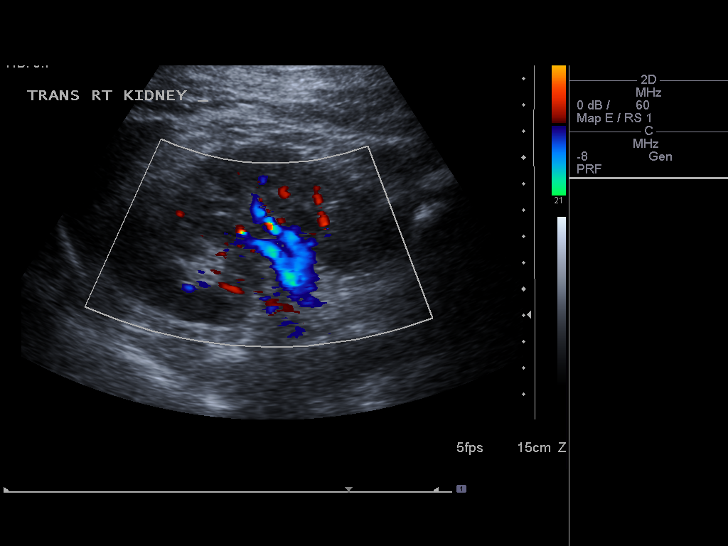
[im 59/71]
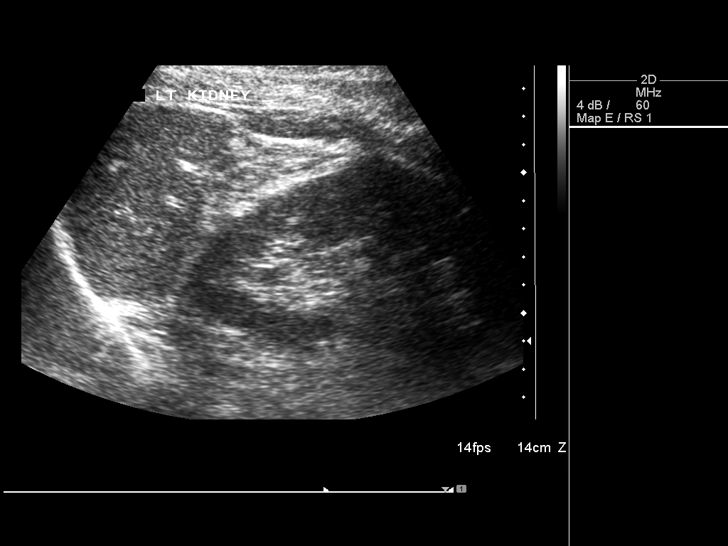
[im 65/71]
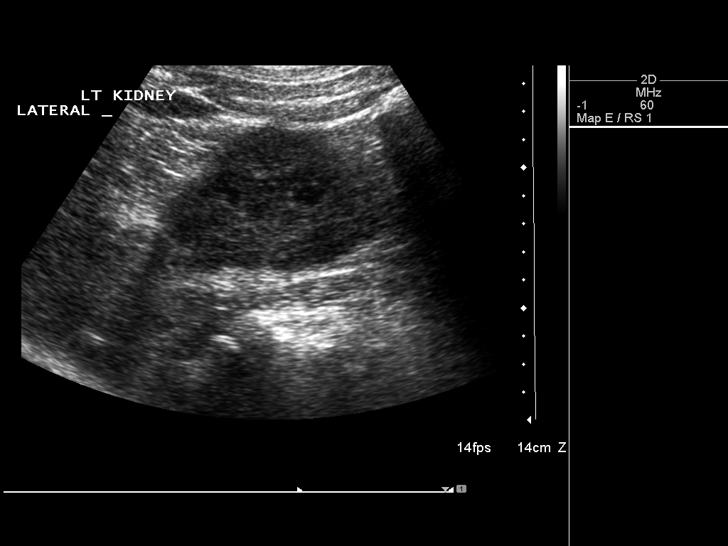
[im 71/71]
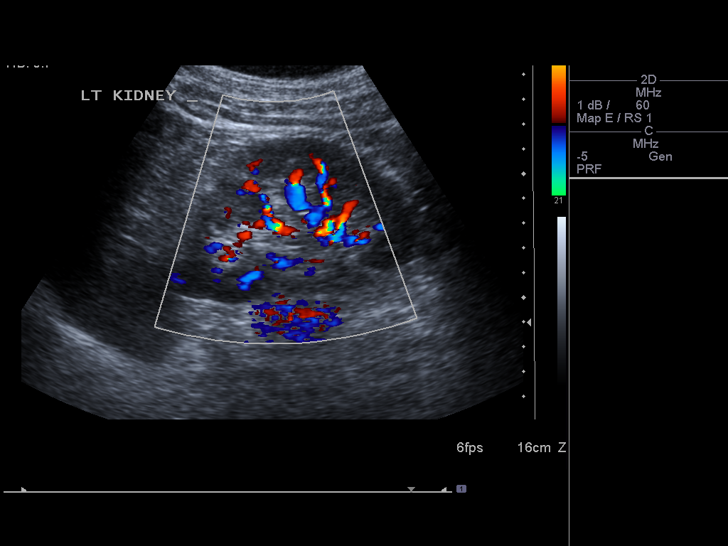

[14 of 25 positions shown; findings below may reference images not displayed]

FINDINGS: Gallbladder: No gallstones or wall thickening visualized. No
sonographic Murphy sign noted by sonographer.

Common bile duct: Diameter: Normal caliber, 4 mm

Liver: No focal lesion identified. Within normal limits in
parenchymal echogenicity.

IVC: No abnormality visualized.

Pancreas: Visualized portion unremarkable.

Spleen: Size and appearance within normal limits.

Right Kidney: Length: 12.6 cm. Echogenicity within normal limits. No
mass or hydronephrosis visualized.

Left Kidney: Length: 11.6 cm. Echogenicity within normal limits. No
mass or hydronephrosis visualized.

Abdominal aorta: No aneurysm visualized.

Other findings: None.
IMPRESSION: Unremarkable abdominal ultrasound.

## 2018-01-22 ENCOUNTER — Ambulatory Visit: Payer: 59 | Admitting: Endocrinology

## 2018-01-22 DIAGNOSIS — Z0289 Encounter for other administrative examinations: Secondary | ICD-10-CM

## 2018-01-29 ENCOUNTER — Encounter: Payer: Self-pay | Admitting: Endocrinology

## 2018-01-29 ENCOUNTER — Ambulatory Visit (INDEPENDENT_AMBULATORY_CARE_PROVIDER_SITE_OTHER): Payer: 59 | Admitting: Endocrinology

## 2018-01-29 VITALS — BP 132/80 | HR 66 | Ht 69.0 in | Wt 201.8 lb

## 2018-01-29 DIAGNOSIS — E1051 Type 1 diabetes mellitus with diabetic peripheral angiopathy without gangrene: Secondary | ICD-10-CM

## 2018-01-29 DIAGNOSIS — Z23 Encounter for immunization: Secondary | ICD-10-CM | POA: Diagnosis not present

## 2018-01-29 DIAGNOSIS — Z125 Encounter for screening for malignant neoplasm of prostate: Secondary | ICD-10-CM | POA: Diagnosis not present

## 2018-01-29 DIAGNOSIS — Z0000 Encounter for general adult medical examination without abnormal findings: Secondary | ICD-10-CM | POA: Diagnosis not present

## 2018-01-29 DIAGNOSIS — I2510 Atherosclerotic heart disease of native coronary artery without angina pectoris: Secondary | ICD-10-CM

## 2018-01-29 LAB — POCT GLYCOSYLATED HEMOGLOBIN (HGB A1C): HEMOGLOBIN A1C: 8.8 % — AB (ref 4.0–5.6)

## 2018-01-29 NOTE — Patient Instructions (Addendum)
please take 4-5 clicks per meal.   check your blood sugar 4 times a day: before the 3 meals, and at bedtime.  also check if you have symptoms of your blood sugar being too high or too low.  please keep a record of the readings and bring it to your next appointment here (or you can bring the meter itself).  You can write it on any piece of paper.  please call us sooner if your blood sugar goes below 70, or if you have a lot of readings over 200.   Blood and urine tests are requested for you today.  We'll let you know about the results.   Please consider these measures for your health:  minimize alcohol.  Do not use tobacco products.  Have a colonoscopy at least every 10 years from age 56.  Keep firearms safely stored.  Always use seat belts.  have working smoke alarms in your home.  See an eye doctor and dentist regularly.  Never drive under the influence of alcohol or drugs (including prescription drugs).  Those with fair skin should take precautions against the sun, and should carefully examine their skin once per month, for any new or changed moles.   Please come back for a follow-up appointment in 2 months.

## 2018-01-29 NOTE — Progress Notes (Signed)
Subjective:    Patient ID: Erik Bird, male    DOB: [birth year], 57 y.o.   MRN: [id number]  HPI Pt is here for regular wellness examination, and is feeling pretty well in general, and says chronic med probs are stable, except as noted below Past Medical History:  Diagnosis Date  . CAD (coronary artery disease)    Inferior STEMI 6/73 complicated by cardiac arrest requiring CPR.  LHC showed  total occlusion proximal RCA, 60-70% proximal LAD at D1, and 60% ostial D1.  Patient had 2 overlapping  PROMUS stents to the RCA.  . Carotid stenosis 10/2009   Carotid doppler (6/11): 0-39% bilateral ICA stenosis.  . DM (diabetes mellitus) ([place])    on insulin.  Developed diabetes after a coxsackievirus infection.  . Hematuria 09/2009    Probably foley catheter trauma with hematuria   . Hyperlipidemia   . Ischemic cardiomyopathy    Echo (5/11) EF 30-35% with inferior and inferoseptal akinesis, posterior  hypokinesis, mild MR, mild to moderately decreased RV systolic function, dilated IVC.   Echo (6/11): EF  35-40%, inferior and inferoseptal akinesis, no significant valvular abnormalities.  Cardiac MRI (9/11): EF  42%, mildly dilated LV, mid to apical inferior and mid posterior 76-99% thickness subendocardial delayed  enhance  . Myocardial infarction [facility]) 2012   w stents  . Tobacco abuse     Past Surgical History:  Procedure Laterality Date  . CARDIAC CATHETERIZATION  2012  . NO PAST SURGERIES      Social History   Socioeconomic History  . Marital status: Single    Spouse name: Not on file  . Number of children: 1  . Years of education: Not on file  . Highest education level: Not on file  Occupational History  . Occupation: maintenance  Social Needs  . Financial resource strain: Not on file  . Food insecurity:    Worry: Not on file    Inability: Not on file  . Transportation needs:    Medical: Not on file    Non-medical: Not on file  Tobacco Use  . Smoking status: Former Smoker   Packs/day: 1.00    Years: 30.00    Pack years: 30.00    Types: Cigarettes    Last attempt to quit: 10/03/2009    Years since quitting: 8.3  . Smokeless tobacco: Never Used  . Tobacco comment: quit smoking in 2011  Substance and Sexual Activity  . Alcohol use: No    Alcohol/week: 0.0 standard drinks  . Drug use: No  . Sexual activity: Not on file  Lifestyle  . Physical activity:    Days per week: Not on file    Minutes per session: Not on file  . Stress: Not on file  Relationships  . Social connections:    Talks on phone: Not on file    Gets together: Not on file    Attends religious service: Not on file    Active member of club or organization: Not on file    Attends meetings of clubs or organizations: Not on file    Relationship status: Not on file  . Intimate partner violence:    Fear of current or ex partner: Not on file    Emotionally abused: Not on file    Physically abused: Not on file    Forced sexual activity: Not on file  Other Topics Concern  . Not on file  Social History Narrative  . Not on file    Current  Outpatient Medications on File Prior to Visit  Medication Sig Dispense Refill  . Ascorbic Acid (VITAMIN C) 1000 MG tablet Take 1,000 mg by mouth daily.    Marland Kitchen aspirin EC 81 MG EC tablet Take 325 mg by mouth daily. Take two tablets daily    . atorvastatin (LIPITOR) 80 MG tablet Take 1 tablet (80 mg total) by mouth daily. 90 tablet 0  . carvedilol (COREG) 6.25 MG tablet TAKE 1 TABLET BY MOUTH TWICE A DAY. NEED APPOINTMENT FOR REFILLS 15 tablet 0  . Cinnamon Oil OIL by Does not apply route. 1000 two times a day     . cyclobenzaprine (FLEXERIL) 5 MG tablet TAKE ONE TABLET BY MOUTH EVERY 8 HOURS AS NEEDED FOR MUSCLE SPASM 90 tablet 5  . fish oil-omega-3 fatty acids 1000 MG capsule Take 1 capsule by mouth 2 (two) times daily. Reported on 06/01/2015    . Garlic-Parsley 161-09 MG CAPS Take 1 capsule by mouth daily.      Marland Kitchen glucose blood test strip 4X a day, and lancets  250.030  Variable glucoses     . Insulin Disposable Pump (V-GO 40) KIT Inject 3-4 clicks per meal. 30 kit 11  . Lancet Devices MISC 1 Device by Does not apply route 4 (four) times daily. 360 each 3  . lisinopril (PRINIVIL,ZESTRIL) 10 MG tablet Take 1 tablet (10 mg total) by mouth 2 (two) times daily. 180 tablet 3  . nitroGLYCERIN (NITROSTAT) 0.4 MG SL tablet Place 1 tablet (0.4 mg total) under the tongue every 5 (five) minutes as needed for chest pain. 1 SL as needed for chest pain 100 tablet 1  . omeprazole (PRILOSEC) 40 MG capsule Take 1 capsule (40 mg total) by mouth daily. 90 capsule 3  . spironolactone (ALDACTONE) 25 MG tablet TAKE 1 TABLET (25 MG TOTAL) BY MOUTH DAILY. 90 tablet 0  . vitamin E 400 UNIT capsule Take 400 Units by mouth daily.      No current facility-administered medications on file prior to visit.     No Known Allergies  Family History  Problem Relation Age of Onset  . Heart attack Father 5  . Heart disease Father   . Arthritis Unknown   . Heart disease Mother   . Diabetes Unknown        grandparent  . Hyperlipidemia Unknown   . Hypertension Unknown   . Lung cancer Unknown   . Stroke Unknown   . Colon cancer Neg Hx     BP 132/80   Pulse 66   Ht '5\' 9"'$  (1.753 m)   Wt 201 lb 12.8 oz (91.5 kg)   SpO2 95%   BMI 29.80 kg/m     Review of Systems Denies fever, fatigue, visual loss, hearing loss, chest pain, sob, back pain, depression, cold intolerance, BRBPR, hematuria, syncope, numbness, allergy sxs, easy bruising, and rash.      Objective:   Physical Exam VS: see vs page GEN: no distress HEAD: head: no deformity eyes: no periorbital swelling, no proptosis external nose and ears are normal mouth: no lesion seen NECK: supple, thyroid is not enlarged CHEST WALL: no deformity LUNGS: clear to auscultation CV: reg rate and rhythm, no murmur ABD: abdomen is soft, nontender.  no hepatosplenomegaly.  not distended.  no hernia.  MUSCULOSKELETAL: muscle  bulk and strength are grossly normal.  no obvious joint swelling.  gait is normal and steady. PULSES: no carotid bruit NEURO:  cn 2-12 grossly intact.   readily moves all  4's.  s SKIN:  Normal texture and temperature.  No rash or suspicious lesion is visible.   NODES:  None palpable at the neck PSYCH: alert, well-oriented.  Does not appear anxious nor depressed.   I personally reviewed electrocardiogram tracing (today): Indication: wellness Impression: NSR.  Poss old IMI.  No hypertrophy. Compared to: no significant change      Assessment & Plan:  Wellness visit today, with problems stable, except as noted.   SEPARATE EVALUATION FOLLOWS--EACH PROBLEM HERE IS NEW, NOT RESPONDING TO TREATMENT, OR POSES SIGNIFICANT RISK TO THE PATIENT'S HEALTH: HISTORY OF THE PRESENT ILLNESS: Pt returns for f/u of diabetes mellitus: DM type: 1 Dx'ed: 1998. Complications: polyneuropathy, CAD, retinopathy, and PAD.   Therapy: insulin since dx; V-GO since 2016.  DKA: only at time of dx. Severe hypoglycemia: last episode was in 2009.  Pancreatitis: never.  Other: he did poorly with multiple daily injections, and also with qd insulin.    Interval history: no cbg record, but states cbg's vary from 120-180.  It is in general higher as the day goes on.  He takes approx 3-4 clicks per meal.   PAST MEDICAL HISTORY Past Medical History:  Diagnosis Date  . CAD (coronary artery disease)    Inferior STEMI 6/81 complicated by cardiac arrest requiring CPR.  LHC showed  total occlusion proximal RCA, 60-70% proximal LAD at D1, and 60% ostial D1.  Patient had 2 overlapping  PROMUS stents to the RCA.  . Carotid stenosis 10/2009   Carotid doppler (6/11): 0-39% bilateral ICA stenosis.  . DM (diabetes mellitus) ([place])    on insulin.  Developed diabetes after a coxsackievirus infection.  . Hematuria 09/2009    Probably foley catheter trauma with hematuria   . Hyperlipidemia   . Ischemic cardiomyopathy    Echo (5/11)  EF 30-35% with inferior and inferoseptal akinesis, posterior  hypokinesis, mild MR, mild to moderately decreased RV systolic function, dilated IVC.   Echo (6/11): EF  35-40%, inferior and inferoseptal akinesis, no significant valvular abnormalities.  Cardiac MRI (9/11): EF  42%, mildly dilated LV, mid to apical inferior and mid posterior 76-99% thickness subendocardial delayed  enhance  . Myocardial infarction [facility]) 2012   w stents  . Tobacco abuse     Past Surgical History:  Procedure Laterality Date  . CARDIAC CATHETERIZATION  2012  . NO PAST SURGERIES      Social History   Socioeconomic History  . Marital status: Single    Spouse name: Not on file  . Number of children: 1  . Years of education: Not on file  . Highest education level: Not on file  Occupational History  . Occupation: maintenance  Social Needs  . Financial resource strain: Not on file  . Food insecurity:    Worry: Not on file    Inability: Not on file  . Transportation needs:    Medical: Not on file    Non-medical: Not on file  Tobacco Use  . Smoking status: Former Smoker    Packs/day: 1.00    Years: 30.00    Pack years: 30.00    Types: Cigarettes    Last attempt to quit: 10/03/2009    Years since quitting: 8.3  . Smokeless tobacco: Never Used  . Tobacco comment: quit smoking in 2011  Substance and Sexual Activity  . Alcohol use: No    Alcohol/week: 0.0 standard drinks  . Drug use: No  . Sexual activity: Not on file  Lifestyle  . Physical  activity:    Days per week: Not on file    Minutes per session: Not on file  . Stress: Not on file  Relationships  . Social connections:    Talks on phone: Not on file    Gets together: Not on file    Attends religious service: Not on file    Active member of club or organization: Not on file    Attends meetings of clubs or organizations: Not on file    Relationship status: Not on file  . Intimate partner violence:    Fear of current or ex partner: Not on  file    Emotionally abused: Not on file    Physically abused: Not on file    Forced sexual activity: Not on file  Other Topics Concern  . Not on file  Social History Narrative  . Not on file    Current Outpatient Medications on File Prior to Visit  Medication Sig Dispense Refill  . Ascorbic Acid (VITAMIN C) 1000 MG tablet Take 1,000 mg by mouth daily.    Marland Kitchen aspirin EC 81 MG EC tablet Take 325 mg by mouth daily. Take two tablets daily    . atorvastatin (LIPITOR) 80 MG tablet Take 1 tablet (80 mg total) by mouth daily. 90 tablet 0  . carvedilol (COREG) 6.25 MG tablet TAKE 1 TABLET BY MOUTH TWICE A DAY. NEED APPOINTMENT FOR REFILLS 15 tablet 0  . Cinnamon Oil OIL by Does not apply route. 1000 two times a day     . cyclobenzaprine (FLEXERIL) 5 MG tablet TAKE ONE TABLET BY MOUTH EVERY 8 HOURS AS NEEDED FOR MUSCLE SPASM 90 tablet 5  . fish oil-omega-3 fatty acids 1000 MG capsule Take 1 capsule by mouth 2 (two) times daily. Reported on 06/01/2015    . Garlic-Parsley 381-01 MG CAPS Take 1 capsule by mouth daily.      Marland Kitchen glucose blood test strip 4X a day, and lancets 250.030  Variable glucoses     . Insulin Disposable Pump (V-GO 40) KIT Inject 3-4 clicks per meal. 30 kit 11  . Lancet Devices MISC 1 Device by Does not apply route 4 (four) times daily. 360 each 3  . lisinopril (PRINIVIL,ZESTRIL) 10 MG tablet Take 1 tablet (10 mg total) by mouth 2 (two) times daily. 180 tablet 3  . nitroGLYCERIN (NITROSTAT) 0.4 MG SL tablet Place 1 tablet (0.4 mg total) under the tongue every 5 (five) minutes as needed for chest pain. 1 SL as needed for chest pain 100 tablet 1  . omeprazole (PRILOSEC) 40 MG capsule Take 1 capsule (40 mg total) by mouth daily. 90 capsule 3  . spironolactone (ALDACTONE) 25 MG tablet TAKE 1 TABLET (25 MG TOTAL) BY MOUTH DAILY. 90 tablet 0  . vitamin E 400 UNIT capsule Take 400 Units by mouth daily.      No current facility-administered medications on file prior to visit.     No Known  Allergies  Family History  Problem Relation Age of Onset  . Heart attack Father 31  . Heart disease Father   . Arthritis Unknown   . Heart disease Mother   . Diabetes Unknown        grandparent  . Hyperlipidemia Unknown   . Hypertension Unknown   . Lung cancer Unknown   . Stroke Unknown   . Colon cancer Neg Hx     BP 132/80   Pulse 66   Ht '5\' 9"'$  (1.753 m)   Wt 201  lb 12.8 oz (91.5 kg)   SpO2 95%   BMI 29.80 kg/m   REVIEW OF SYSTEMS: He has lost a few lbs PHYSICAL EXAMINATION: VITAL SIGNS:  See vs page GENERAL: no distress Pulses: foot pulses are intact bilaterally.   MSK: no deformity of the feet or ankles.  CV: no edema of the legs or ankles Skin:  no ulcer on the feet or ankles.  normal color and temp on the feet and ankles Neuro: sensation is intact to touch on the feet and ankles.   LAB/XRAY RESULTS: Lab Results  Component Value Date   HGBA1C 8.8 (A) 01/29/2018   IMPRESSION: Type 1 DM, with PAD.  He may need to change to V-GO-30 PLAN:  Increase clicks to 4-1/HOY

## 2018-02-24 ENCOUNTER — Ambulatory Visit: Payer: 59 | Admitting: Internal Medicine

## 2018-04-05 ENCOUNTER — Ambulatory Visit: Payer: 59 | Admitting: Endocrinology

## 2018-04-05 DIAGNOSIS — Z0289 Encounter for other administrative examinations: Secondary | ICD-10-CM

## 2018-06-28 ENCOUNTER — Telehealth: Payer: Self-pay | Admitting: Endocrinology

## 2018-06-28 ENCOUNTER — Other Ambulatory Visit: Payer: Self-pay | Admitting: Endocrinology

## 2018-06-28 NOTE — Telephone Encounter (Signed)
error 

## 2018-07-12 ENCOUNTER — Encounter: Payer: Self-pay | Admitting: Endocrinology

## 2018-07-12 ENCOUNTER — Other Ambulatory Visit: Payer: Self-pay

## 2018-07-12 ENCOUNTER — Ambulatory Visit (INDEPENDENT_AMBULATORY_CARE_PROVIDER_SITE_OTHER): Payer: 59 | Admitting: Endocrinology

## 2018-07-12 VITALS — BP 168/68 | HR 80 | Ht 69.0 in | Wt 207.6 lb

## 2018-07-12 DIAGNOSIS — E1051 Type 1 diabetes mellitus with diabetic peripheral angiopathy without gangrene: Secondary | ICD-10-CM

## 2018-07-12 LAB — POCT GLYCOSYLATED HEMOGLOBIN (HGB A1C): Hemoglobin A1C: 8.5 % — AB (ref 4.0–5.6)

## 2018-07-12 MED ORDER — V-GO 30 KIT: 1.0000 | PACK | Freq: Every day | 3 refills | Status: DC

## 2018-07-12 NOTE — Progress Notes (Signed)
Subjective:    Patient ID: Erik Bird, male    DOB: December 05, 1960, 58 y.o.   MRN: [id number]  HPI Pt returns for f/u of diabetes mellitus: DM type: 1 Dx'ed: 1998. Complications: polyneuropathy, CAD, retinopathy, and PAD.   Therapy: insulin since dx; V-GO since 2016.  DKA: only at time of dx. Severe hypoglycemia: last episode was in 2009.  Pancreatitis: never.  Other: he did poorly with multiple daily injections, and also with qd insulin.    Interval history: no cbg record, but states cbg's vary from 50-175.  It is in general higher as the day goes on.  He takes approx 2-3 clicks per meal.   Past Medical History:  Diagnosis Date  . CAD (coronary artery disease)    Inferior STEMI 1/01 complicated by cardiac arrest requiring CPR.  LHC showed  total occlusion proximal RCA, 60-70% proximal LAD at D1, and 60% ostial D1.  Patient had 2 overlapping  PROMUS stents to the RCA.  . Carotid stenosis 10/2009   Carotid doppler (6/11): 0-39% bilateral ICA stenosis.  . DM (diabetes mellitus) ([place])    on insulin.  Developed diabetes after a coxsackievirus infection.  . Hematuria 09/2009    Probably foley catheter trauma with hematuria   . Hyperlipidemia   . Ischemic cardiomyopathy    Echo (5/11) EF 30-35% with inferior and inferoseptal akinesis, posterior  hypokinesis, mild MR, mild to moderately decreased RV systolic function, dilated IVC.   Echo (6/11): EF  35-40%, inferior and inferoseptal akinesis, no significant valvular abnormalities.  Cardiac MRI (9/11): EF  42%, mildly dilated LV, mid to apical inferior and mid posterior 76-99% thickness subendocardial delayed  enhance  . Myocardial infarction [facility]) 2012   w stents  . Tobacco abuse     Past Surgical History:  Procedure Laterality Date  . CARDIAC CATHETERIZATION  2012  . NO PAST SURGERIES      Social History   Socioeconomic History  . Marital status: Single    Spouse name: Not on file  . Number of children: 1  . Years of education:  Not on file  . Highest education level: Not on file  Occupational History  . Occupation: maintenance  Social Needs  . Financial resource strain: Not on file  . Food insecurity:    Worry: Not on file    Inability: Not on file  . Transportation needs:    Medical: Not on file    Non-medical: Not on file  Tobacco Use  . Smoking status: Former Smoker    Packs/day: 1.00    Years: 30.00    Pack years: 30.00    Types: Cigarettes    Last attempt to quit: 10/03/2009    Years since quitting: 8.7  . Smokeless tobacco: Never Used  . Tobacco comment: quit smoking in 2011  Substance and Sexual Activity  . Alcohol use: No    Alcohol/week: 0.0 standard drinks  . Drug use: No  . Sexual activity: Not on file  Lifestyle  . Physical activity:    Days per week: Not on file    Minutes per session: Not on file  . Stress: Not on file  Relationships  . Social connections:    Talks on phone: Not on file    Gets together: Not on file    Attends religious service: Not on file    Active member of club or organization: Not on file    Attends meetings of clubs or organizations: Not on file  Relationship status: Not on file  . Intimate partner violence:    Fear of current or ex partner: Not on file    Emotionally abused: Not on file    Physically abused: Not on file    Forced sexual activity: Not on file  Other Topics Concern  . Not on file  Social History Narrative  . Not on file    Current Outpatient Medications on File Prior to Visit  Medication Sig Dispense Refill  . Ascorbic Acid (VITAMIN C) 1000 MG tablet Take 1,000 mg by mouth daily.    Marland Kitchen aspirin EC 81 MG EC tablet Take 325 mg by mouth daily. Take two tablets daily    . atorvastatin (LIPITOR) 80 MG tablet Take 1 tablet (80 mg total) by mouth daily. 90 tablet 0  . carvedilol (COREG) 6.25 MG tablet TAKE 1 TABLET BY MOUTH TWICE A DAY. NEED APPOINTMENT FOR REFILLS 15 tablet 0  . Cinnamon Oil OIL by Does not apply route. 1000 two times a  day     . cyclobenzaprine (FLEXERIL) 5 MG tablet TAKE ONE TABLET BY MOUTH EVERY 8 HOURS AS NEEDED FOR MUSCLE SPASM 90 tablet 5  . fish oil-omega-3 fatty acids 1000 MG capsule Take 1 capsule by mouth 2 (two) times daily. Reported on 06/01/2015    . Garlic-Parsley 161-09 MG CAPS Take 1 capsule by mouth daily.      Marland Kitchen glucose blood test strip     . insulin regular (NOVOLIN R,HUMULIN R) 100 units/mL injection Inject into the skin 3 (three) times daily before meals.    Elmore Guise Devices MISC 1 Device by Does not apply route 4 (four) times daily. 360 each 3  . lisinopril (PRINIVIL,ZESTRIL) 10 MG tablet Take 1 tablet (10 mg total) by mouth 2 (two) times daily. 180 tablet 3  . nitroGLYCERIN (NITROSTAT) 0.4 MG SL tablet Place 1 tablet (0.4 mg total) under the tongue every 5 (five) minutes as needed for chest pain. 1 SL as needed for chest pain 100 tablet 1  . omeprazole (PRILOSEC) 40 MG capsule Take 1 capsule (40 mg total) by mouth daily. 90 capsule 3  . spironolactone (ALDACTONE) 25 MG tablet TAKE 1 TABLET (25 MG TOTAL) BY MOUTH DAILY. 90 tablet 0  . vitamin E 400 UNIT capsule Take 400 Units by mouth daily.      No current facility-administered medications on file prior to visit.     No Known Allergies  Family History  Problem Relation Age of Onset  . Heart attack Father 32  . Heart disease Father   . Arthritis Unknown   . Heart disease Mother   . Diabetes Unknown        grandparent  . Hyperlipidemia Unknown   . Hypertension Unknown   . Lung cancer Unknown   . Stroke Unknown   . Colon cancer Neg Hx     BP (!) 168/68 (BP Location: Right Arm, Patient Position: Sitting, Cuff Size: Normal)   Pulse 80   Ht 5\' 9"  (1.753 m)   Wt 207 lb 9.6 oz (94.2 kg)   SpO2 96%   BMI 30.66 kg/m    Review of Systems Denies LOC    Objective:   Physical Exam VITAL SIGNS:  See vs page GENERAL: no distress Pulses: dorsalis pedis intact bilat.   MSK: no deformity of the feet CV: no leg edema Skin:  no  ulcer on the feet.  normal color and temp on the feet. Neuro: sensation is intact to  touch on the feet  Lab Results  Component Value Date   HGBA1C 8.5 (A) 07/12/2018       Assessment & Plan:  HTN: is noted today Type 1 DM: he needs increased rx Hypoglycemia: the pattern of his cbg's indicates he needs more of his daily insulin in mealtimes boluses.   Patient Instructions  Your blood pressure is high today.  Please see your primary care provider soon, to have it rechecked I have sent a prescription to your pharmacy, to change to the V-GO-30.  Therefore, you should increase to 4-5 clicks per meal. check your blood sugar 4 times a day: before the 3 meals, and at bedtime.  also check if you have symptoms of your blood sugar being too high or too low.  please keep a record of the readings and bring it to your next appointment here (or you can bring the meter itself).  You can write it on any piece of paper.  please call us sooner if your blood sugar goes below 70, or if you have a lot of readings over 200. Please come back for a follow-up appointment in 2 months.

## 2018-07-12 NOTE — Patient Instructions (Addendum)
Your blood pressure is high today.  Please see your primary care provider soon, to have it rechecked I have sent a prescription to your pharmacy, to change to the V-GO-30.  Therefore, you should increase to 4-5 clicks per meal. check your blood sugar 4 times a day: before the 3 meals, and at bedtime.  also check if you have symptoms of your blood sugar being too high or too low.  please keep a record of the readings and bring it to your next appointment here (or you can bring the meter itself).  You can write it on any piece of paper.  please call us sooner if your blood sugar goes below 70, or if you have a lot of readings over 200. Please come back for a follow-up appointment in 2 months.

## 2018-07-27 ENCOUNTER — Telehealth: Payer: Self-pay | Admitting: Endocrinology

## 2018-07-27 NOTE — Telephone Encounter (Signed)
refliled medication 07/12/18. Pt need to contact pharmacy.

## 2018-07-27 NOTE — Telephone Encounter (Signed)
MEDICATION: Insulin Disposable Pump (V-GO 30) KIT  PHARMACY:  [id number]- [place], [place] - [address]  IS THIS A 90 DAY SUPPLY :   IS PATIENT OUT OF MEDICATION: yes  IF NOT; HOW MUCH IS LEFT:   LAST APPOINTMENT DATE: _0 /01/2019  NEXT APPOINTMENT DATE:_1  date not found  DO WE HAVE YOUR PERMISSION TO LEAVE A DETAILED MESSAGE:  OTHER COMMENTS:    **Let patient know to contact pharmacy at the end of the day to make sure medication is ready. **  ** Please notify patient to allow 48-72 hours to process**  **Encourage patient to contact the pharmacy for refills or they [facility]*

## 2018-07-28 ENCOUNTER — Other Ambulatory Visit: Payer: Self-pay | Admitting: Endocrinology

## 2018-07-29 ENCOUNTER — Telehealth: Payer: Self-pay | Admitting: Endocrinology

## 2018-07-29 NOTE — Telephone Encounter (Signed)
MEDICATION: V-GO 40  [facility]  IS THIS A 90 DAY SUPPLY :   IS PATIENT OUT OF MEDICATION:   IF NOT; HOW MUCH IS LEFT:   LAST APPOINTMENT DATE: @3 /25/2020  NEXT APPOINTMENT DATE:@Visit  date not found  DO WE HAVE YOUR PERMISSION TO LEAVE A DETAILED MESSAGE:  OTHER COMMENTS:  Patients wife stated that the pharmacy is out of the vgo 30 and they will need the vgo 40  **Let patient know to contact pharmacy at the end of the day to make sure medication is ready. **  ** Please notify patient to allow 48-72 hours to process**  **Encourage patient to contact the pharmacy for refills or they can request refills through [facility]**

## 2018-07-30 ENCOUNTER — Other Ambulatory Visit: Payer: Self-pay | Admitting: Endocrinology

## 2018-07-30 NOTE — Telephone Encounter (Signed)
rx sent

## 2018-08-04 ENCOUNTER — Telehealth: Payer: Self-pay | Admitting: Nutrition

## 2018-08-04 NOTE — Telephone Encounter (Signed)
Wilfred Lacy called me saying that the wife was very upset that she filled the demo pod with insulin, and wants to be reimbursed for the insulin wasted.   Wilfred Lacy reports that he can not do this, and that the demo was only to be put on to see if her husband is comfortable with the device on.  Also that she needs training before beginning this, and she reported that she does not need training, and is very upset and wants to not get this device.   Patient is on R and N insulin, both of which, we do not have.  Please advise

## 2018-08-04 NOTE — Telephone Encounter (Signed)
Please offer a little of our humalog or novolog--please tell her that this is the best we can do.

## 2018-08-06 NOTE — Telephone Encounter (Signed)
Spoke to wife and informed her that we do not have either sample here, she stated that it was ok that he had enough.

## 2019-08-08 ENCOUNTER — Telehealth: Payer: Self-pay | Admitting: Endocrinology

## 2019-08-08 ENCOUNTER — Other Ambulatory Visit: Payer: Self-pay

## 2019-08-08 ENCOUNTER — Other Ambulatory Visit: Payer: Self-pay | Admitting: Endocrinology

## 2019-08-08 DIAGNOSIS — E1051 Type 1 diabetes mellitus with diabetic peripheral angiopathy without gangrene: Secondary | ICD-10-CM

## 2019-08-08 MED ORDER — V-GO 40 KIT: 1.0000 | PACK | 0 refills | Status: DC

## 2019-08-08 NOTE — Telephone Encounter (Signed)
MEDICATION: Insulin Disposable Pump (V-GO 40) KIT  PHARMACY:   [id number] [address] [place]:  [phone] Fax:  [phone]     IS THIS A 90 DAY SUPPLY : No  IS PATIENT OUT OF MEDICATION: Yes-requests sample if possible  IF NOT; HOW MUCH IS LEFT: 0  LAST APPOINTMENT DATE: _0 /09/2019  NEXT APPOINTMENT DATE:_1 /14/2021  DO WE HAVE YOUR PERMISSION TO LEAVE A DETAILED MESSAGE: Yes  OTHER COMMENTS: requests sample if possible   **Let patient know to contact pharmacy at the end of the day to make sure medication is ready. **  ** Please notify patient to allow 48-72 hours to process**  **Encourage patient to contact the pharmacy for refills or [facility]*

## 2019-08-08 NOTE — Telephone Encounter (Signed)
Outpatient Medication Detail   Disp Refills Start End   Insulin Disposable Pump (V-GO 40) KIT 30 kit 0 08/08/2019    Sig - Route: 1 each by Other route See admin instructions. 1 box (NOT KIT) per month - Other   Sent to pharmacy as: Insulin Disposable Pump (V-GO 40) Kit   E-Prescribing Status: Receipt confirmed by pharmacy (08/08/2019 10:42 AM EDT)    Sample cannot be provided

## 2019-08-15 ENCOUNTER — Other Ambulatory Visit: Payer: Self-pay

## 2019-08-17 ENCOUNTER — Encounter: Payer: Self-pay | Admitting: Endocrinology

## 2019-08-17 ENCOUNTER — Ambulatory Visit: Payer: 59 | Admitting: Endocrinology

## 2019-08-17 ENCOUNTER — Other Ambulatory Visit: Payer: Self-pay

## 2019-08-17 VITALS — BP 160/70 | HR 78 | Ht 69.0 in | Wt 199.8 lb

## 2019-08-17 DIAGNOSIS — E1051 Type 1 diabetes mellitus with diabetic peripheral angiopathy without gangrene: Secondary | ICD-10-CM

## 2019-08-17 LAB — POCT GLYCOSYLATED HEMOGLOBIN (HGB A1C): Hemoglobin A1C: 7.5 % — AB (ref 4.0–5.6)

## 2019-08-17 MED ORDER — V-GO 30 KIT: 1.0000 | PACK | Freq: Every day | 3 refills | Status: AC

## 2019-08-17 NOTE — Progress Notes (Signed)
Subjective:    Patient ID: Erik Bird, male    DOB: 16-Mar-1961, 59 y.o.   MRN: [id number]  HPI Pt returns for f/u of diabetes mellitus: DM type: 1 Dx'ed: 1998. Complications: polyneuropathy, CAD, retinopathy, and PAD.   Therapy: insulin since dx; V-GO since 2016.  DKA: only at time of dx. Severe hypoglycemia: last episode was in 2009.  Pancreatitis: never.  Other: he did poorly with multiple daily injections, and also with qd insulin; he eats 2 meals per day.    Interval history: no cbg record, but states cbg's vary from 50-160.  It is in general higher as the day goes on.  He takes approx 2-3 clicks per meal.  V-GO was changed to the -30 last year, but these were unavailable.    Past Medical History:  Diagnosis Date  . CAD (coronary artery disease)    Inferior STEMI Q000111Q complicated by cardiac arrest requiring CPR.  LHC showed  total occlusion proximal RCA, 60-70% proximal LAD at D1, and 60% ostial D1.  Patient had 2 overlapping  PROMUS stents to the RCA.  . Carotid stenosis 10/2009   Carotid doppler (6/11): 0-39% bilateral ICA stenosis.  . DM (diabetes mellitus) ([place])    on insulin.  Developed diabetes after a coxsackievirus infection.  . Hematuria 09/2009    Probably foley catheter trauma with hematuria   . Hyperlipidemia   . Ischemic cardiomyopathy    Echo (5/11) EF 30-35% with inferior and inferoseptal akinesis, posterior  hypokinesis, mild MR, mild to moderately decreased RV systolic function, dilated IVC.   Echo (6/11): EF  35-40%, inferior and inferoseptal akinesis, no significant valvular abnormalities.  Cardiac MRI (9/11): EF  42%, mildly dilated LV, mid to apical inferior and mid posterior 76-99% thickness subendocardial delayed  enhance  . Myocardial infarction [facility]) 2012   w stents  . Tobacco abuse     Past Surgical History:  Procedure Laterality Date  . CARDIAC CATHETERIZATION  2012  . NO PAST SURGERIES      Social History   Socioeconomic History  . Marital  status: Single    Spouse name: Not on file  . Number of children: 1  . Years of education: Not on file  . Highest education level: Not on file  Occupational History  . Occupation: maintenance  Tobacco Use  . Smoking status: Former Smoker    Packs/day: 1.00    Years: 30.00    Pack years: 30.00    Types: Cigarettes    Quit date: 10/03/2009    Years since quitting: 9.8  . Smokeless tobacco: Never Used  . Tobacco comment: quit smoking in 2011  Substance and Sexual Activity  . Alcohol use: No    Alcohol/week: 0.0 standard drinks  . Drug use: No  . Sexual activity: Not on file  Other Topics Concern  . Not on file  Social History Narrative  . Not on file   Social Determinants of Health   Financial Resource Strain:   . Difficulty of Paying Living Expenses:   Food Insecurity:   . Worried About Charity fundraiser in the Last Year:   . Arboriculturist in the Last Year:   Transportation Needs:   . Film/video editor (Medical):   Marland Kitchen Lack of Transportation (Non-Medical):   Physical Activity:   . Days of Exercise per Week:   . Minutes of Exercise per Session:   Stress:   . Feeling of Stress :   Social Connections:   .  Frequency of Communication with Friends and Family:   . Frequency of Social Gatherings with Friends and Family:   . Attends Religious Services:   . Active Member of Clubs or Organizations:   . Attends Archivist Meetings:   Marland Kitchen Marital Status:   Intimate Partner Violence:   . Fear of Current or Ex-Partner:   . Emotionally Abused:   Marland Kitchen Physically Abused:   . Sexually Abused:     Current Outpatient Medications on File Prior to Visit  Medication Sig Dispense Refill  . Ascorbic Acid (VITAMIN C) 1000 MG tablet Take 1,000 mg by mouth daily.    Marland Kitchen aspirin EC 81 MG EC tablet Take 325 mg by mouth daily. Take two tablets daily    . atorvastatin (LIPITOR) 80 MG tablet Take 1 tablet (80 mg total) by mouth daily. 90 tablet 0  . carvedilol (COREG) 6.25 MG tablet  TAKE 1 TABLET BY MOUTH TWICE A DAY. NEED APPOINTMENT FOR REFILLS 15 tablet 0  . Cinnamon Oil OIL by Does not apply route. 1000 two times a day     . cyclobenzaprine (FLEXERIL) 5 MG tablet TAKE ONE TABLET BY MOUTH EVERY 8 HOURS AS NEEDED FOR MUSCLE SPASM 90 tablet 5  . fish oil-omega-3 fatty acids 1000 MG capsule Take 1 capsule by mouth 2 (two) times daily. Reported on 06/01/2015    . Garlic-Parsley Q000111Q MG CAPS Take 1 capsule by mouth daily.      Marland Kitchen glucose blood test strip     . insulin regular (NOVOLIN R,HUMULIN R) 100 units/mL injection Inject into the skin 3 (three) times daily before meals.    Elmore Guise Devices MISC 1 Device by Does not apply route 4 (four) times daily. 360 each 3  . lisinopril (PRINIVIL,ZESTRIL) 10 MG tablet Take 1 tablet (10 mg total) by mouth 2 (two) times daily. 180 tablet 3  . nitroGLYCERIN (NITROSTAT) 0.4 MG SL tablet Place 1 tablet (0.4 mg total) under the tongue every 5 (five) minutes as needed for chest pain. 1 SL as needed for chest pain 100 tablet 1  . omeprazole (PRILOSEC) 40 MG capsule Take 1 capsule (40 mg total) by mouth daily. 90 capsule 3  . spironolactone (ALDACTONE) 25 MG tablet TAKE 1 TABLET (25 MG TOTAL) BY MOUTH DAILY. 90 tablet 0  . vitamin E 400 UNIT capsule Take 400 Units by mouth daily.      No current facility-administered medications on file prior to visit.    No Known Allergies  Family History  Problem Relation Age of Onset  . Heart attack Father 44  . Heart disease Father   . Arthritis Unknown   . Heart disease Mother   . Diabetes Unknown        grandparent  . Hyperlipidemia Unknown   . Hypertension Unknown   . Lung cancer Unknown   . Stroke Unknown   . Colon cancer Neg Hx     BP (!) 160/70   Pulse 78   Ht 5\' 9"  (1.753 m)   Wt 199 lb 12.8 oz (90.6 kg)   SpO2 91%   BMI 29.51 kg/m    Review of Systems Denies LOC    Objective:   Physical Exam VITAL SIGNS:  See vs page GENERAL: no distress Pulses: dorsalis pedis intact  bilat.   MSK: no deformity of the feet CV: no leg edema Skin:  no ulcer on the feet.  normal color and temp on the feet. Neuro: sensation is intact to touch  on the feet.    Lab Results  Component Value Date   HGBA1C 7.5 (A) 08/17/2019       Assessment & Plan:  Type 1 DM, with PAD: The pattern of his cbg's indicates he needs less basal and more mealtime bolus insulin HTN: is noted today   Patient Instructions  Your blood pressure is high today.  Please see your primary care provider soon, to have it rechecked I have sent a prescription to your pharmacy, to change to the V-GO-30.  Therefore, you should increase to 4-5 clicks per meal. If you are unable to obtain the V-GO-30, please go back to the 40, with 2-3 clicks per meal. check your blood sugar 4 times a day: before the 3 meals, and at bedtime.  also check if you have symptoms of your blood sugar being too high or too low.  please keep a record of the readings and bring it to your next appointment here (or you can bring the meter itself).  You can write it on any piece of paper.  please call us sooner if your blood sugar goes below 70, or if you have a lot of readings over 200. Please come back for a follow-up appointment in 3 months.

## 2019-08-17 NOTE — Patient Instructions (Addendum)
Your blood pressure is high today.  Please see your primary care provider soon, to have it rechecked I have sent a prescription to your pharmacy, to change to the V-GO-30.  Therefore, you should increase to 4-5 clicks per meal. If you are unable to obtain the V-GO-30, please go back to the 40, with 2-3 clicks per meal. check your blood sugar 4 times a day: before the 3 meals, and at bedtime.  also check if you have symptoms of your blood sugar being too high or too low.  please keep a record of the readings and bring it to your next appointment here (or you can bring the meter itself).  You can write it on any piece of paper.  please call us sooner if your blood sugar goes below 70, or if you have a lot of readings over 200. Please come back for a follow-up appointment in 3 months.

## 2019-08-23 ENCOUNTER — Telehealth: Payer: Self-pay | Admitting: Endocrinology

## 2019-08-23 NOTE — Telephone Encounter (Signed)
Patient's wife Thayer Headings requests to be called at ph# [phone] re: non emergency situation-Struggling to get V-Go 30. Thayer Headings requests samples if available. V-Go told patient it is too soon to get the V-Go 30 since he had the V-Go 40.

## 2019-08-23 NOTE — Telephone Encounter (Signed)
Please call pt to follow up.

## 2019-08-24 ENCOUNTER — Encounter: Payer: Self-pay | Admitting: General Practice

## 2019-08-24 NOTE — Telephone Encounter (Signed)
Wife reports that she was having difficulty with copay card covering her 90 day supply, and that the did get the the V-go 30s, but only a 1 month supply.  I told her that the prescription is for 90 V-go 30s, and that is between the pharmacy and her insurance company.  She will call the pharmacy and if she has problems with getting 90 days, will go to another pharmacy, and call me back with that information.

## 2019-11-16 ENCOUNTER — Ambulatory Visit: Payer: 59 | Admitting: Endocrinology

## 2019-11-16 ENCOUNTER — Other Ambulatory Visit: Payer: Self-pay

## 2019-11-16 VITALS — BP 124/68 | HR 68 | Ht 69.0 in | Wt 196.4 lb

## 2019-11-16 DIAGNOSIS — E1051 Type 1 diabetes mellitus with diabetic peripheral angiopathy without gangrene: Secondary | ICD-10-CM | POA: Diagnosis not present

## 2019-11-16 LAB — POCT GLYCOSYLATED HEMOGLOBIN (HGB A1C): Hemoglobin A1C: 8.5 % — AB (ref 4.0–5.6)

## 2019-11-16 NOTE — Patient Instructions (Addendum)
Please increase to 3-4 clicks per meal. check your blood sugar 4 times a day: before the 3 meals, and at bedtime.  also check if you have symptoms of your blood sugar being too high or too low.  please keep a record of the readings and bring it to your next appointment here (or you can bring the meter itself).  You can write it on any piece of paper.  please call us sooner if your blood sugar goes below 70, or if you have a lot of readings over 200.   Please come back for a follow-up appointment in 2 months.

## 2019-11-16 NOTE — Progress Notes (Signed)
Subjective:    Patient ID: Erik Bird, male    DOB: [birth year], 59 y.o.   MRN: [id number]  HPI Pt returns for f/u of diabetes mellitus: DM type: 1 Dx'ed: 1998. Complications: PN, CAD, DR, and PAD.   Therapy: insulin since dx; V-GO since 2016.  DKA: only at time of dx. Severe hypoglycemia: last episode was in 2009.  Pancreatitis: never.  SDOH: he gets reg insulin at [facility], due to cost.   Other: he did poorly with multiple daily injections, and also with qd insulin; he eats 2 meals per day.    Interval history: no cbg record, but states cbg's vary from 118-176.  It is in general higher as the day goes on.  He takes approx 2-3 clicks per meal.  He uses V-G0-30.   Past Medical History:  Diagnosis Date  . CAD (coronary artery disease)    Inferior STEMI 9/16 complicated by cardiac arrest requiring CPR.  LHC showed  total occlusion proximal RCA, 60-70% proximal LAD at D1, and 60% ostial D1.  Patient had 2 overlapping  PROMUS stents to the RCA.  . Carotid stenosis 10/2009   Carotid doppler (6/11): 0-39% bilateral ICA stenosis.  . DM (diabetes mellitus) ([place])    on insulin.  Developed diabetes after a coxsackievirus infection.  . Hematuria 09/2009    Probably foley catheter trauma with hematuria   . Hyperlipidemia   . Ischemic cardiomyopathy    Echo (5/11) EF 30-35% with inferior and inferoseptal akinesis, posterior  hypokinesis, mild MR, mild to moderately decreased RV systolic function, dilated IVC.   Echo (6/11): EF  35-40%, inferior and inferoseptal akinesis, no significant valvular abnormalities.  Cardiac MRI (9/11): EF  42%, mildly dilated LV, mid to apical inferior and mid posterior 76-99% thickness subendocardial delayed  enhance  . Myocardial infarction [facility]) 2012   w stents  . Tobacco abuse     Past Surgical History:  Procedure Laterality Date  . CARDIAC CATHETERIZATION  2012  . NO PAST SURGERIES      Social History   Socioeconomic History  . Marital status: Single     Spouse name: Not on file  . Number of children: 1  . Years of education: Not on file  . Highest education level: Not on file  Occupational History  . Occupation: maintenance  Tobacco Use  . Smoking status: Former Smoker    Packs/day: 1.00    Years: 30.00    Pack years: 30.00    Types: Cigarettes    Quit date: 10/03/2009    Years since quitting: 10.1  . Smokeless tobacco: Never Used  . Tobacco comment: quit smoking in 2011  Substance and Sexual Activity  . Alcohol use: No    Alcohol/week: 0.0 standard drinks  . Drug use: No  . Sexual activity: Not on file  Other Topics Concern  . Not on file  Social History Narrative  . Not on file   Social Determinants of Health   Financial Resource Strain:   . Difficulty of Paying Living Expenses:   Food Insecurity:   . Worried About Charity fundraiser in the Last Year:   . Arboriculturist in the Last Year:   Transportation Needs:   . Film/video editor (Medical):   Marland Kitchen Lack of Transportation (Non-Medical):   Physical Activity:   . Days of Exercise per Week:   . Minutes of Exercise per Session:   Stress:   . Feeling of Stress :  Social Connections:   . Frequency of Communication with Friends and Family:   . Frequency of Social Gatherings with Friends and Family:   . Attends Religious Services:   . Active Member of Clubs or Organizations:   . Attends Archivist Meetings:   Marland Kitchen Marital Status:   Intimate Partner Violence:   . Fear of Current or Ex-Partner:   . Emotionally Abused:   Marland Kitchen Physically Abused:   . Sexually Abused:     Current Outpatient Medications on File Prior to Visit  Medication Sig Dispense Refill  . Ascorbic Acid (VITAMIN C) 1000 MG tablet Take 1,000 mg by mouth daily.    Marland Kitchen aspirin EC 81 MG EC tablet Take 325 mg by mouth daily. Take two tablets daily    . atorvastatin (LIPITOR) 80 MG tablet Take 1 tablet (80 mg total) by mouth daily. 90 tablet 0  . carvedilol (COREG) 6.25 MG tablet TAKE 1 TABLET BY  MOUTH TWICE A DAY. NEED APPOINTMENT FOR REFILLS 15 tablet 0  . Cinnamon Oil OIL by Does not apply route. 1000 two times a day     . cyclobenzaprine (FLEXERIL) 5 MG tablet TAKE ONE TABLET BY MOUTH EVERY 8 HOURS AS NEEDED FOR MUSCLE SPASM 90 tablet 5  . fish oil-omega-3 fatty acids 1000 MG capsule Take 1 capsule by mouth 2 (two) times daily. Reported on 06/01/2015    . Garlic-Parsley 546-50 MG CAPS Take 1 capsule by mouth daily.      Marland Kitchen glucose blood test strip     . Insulin Disposable Pump (V-GO 30) KIT 1 Device by Does not apply route daily. 90 kit 3  . insulin regular (NOVOLIN R,HUMULIN R) 100 units/mL injection Inject into the skin 3 (three) times daily before meals.    Elmore Guise Devices MISC 1 Device by Does not apply route 4 (four) times daily. 360 each 3  . lisinopril (PRINIVIL,ZESTRIL) 10 MG tablet Take 1 tablet (10 mg total) by mouth 2 (two) times daily. 180 tablet 3  . nitroGLYCERIN (NITROSTAT) 0.4 MG SL tablet Place 1 tablet (0.4 mg total) under the tongue every 5 (five) minutes as needed for chest pain. 1 SL as needed for chest pain 100 tablet 1  . omeprazole (PRILOSEC) 40 MG capsule Take 1 capsule (40 mg total) by mouth daily. 90 capsule 3  . spironolactone (ALDACTONE) 25 MG tablet TAKE 1 TABLET (25 MG TOTAL) BY MOUTH DAILY. 90 tablet 0  . vitamin E 400 UNIT capsule Take 400 Units by mouth daily.      No current facility-administered medications on file prior to visit.    No Known Allergies  Family History  Problem Relation Age of Onset  . Heart attack Father 65  . Heart disease Father   . Arthritis Unknown   . Heart disease Mother   . Diabetes Unknown        grandparent  . Hyperlipidemia Unknown   . Hypertension Unknown   . Lung cancer Unknown   . Stroke Unknown   . Colon cancer Neg Hx     BP 124/68   Pulse 68   Ht _0  (1.753 m)   Wt 196 lb 6.4 oz (89.1 kg)   SpO2 95%   BMI 29.00 kg/m    Review of Systems He denies hypoglycemia.     Objective:   Physical  Exam VITAL SIGNS:  See vs page.  GENERAL: no distress.  Pulses: dorsalis pedis intact bilat.   MSK: no deformity of  the feet CV: no leg edema.  Skin:  no ulcer on the feet.  normal color and temp on the feet. Neuro: sensation is intact to touch on the feet.   Lab Results  Component Value Date   HGBA1C 8.5 (A) 11/16/2019       Assessment & Plan:  Type 1 DM, with PAD: worse  Patient Instructions  Please increase to 3-4 clicks per meal. check your blood sugar 4 times a day: before the 3 meals, and at bedtime.  also check if you have symptoms of your blood sugar being too high or too low.  please keep a record of the readings and bring it to your next appointment here (or you can bring the meter itself).  You can write it on any piece of paper.  please call us sooner if your blood sugar goes below 70, or if you have a lot of readings over 200.   Please come back for a follow-up appointment in 2 months.

## 2019-12-04 DEATH — deceased

## 2020-02-03 ENCOUNTER — Ambulatory Visit: Payer: 59 | Admitting: Endocrinology
